# Patient Record
Sex: Female | Born: 1970 | Race: White | Hispanic: No | Marital: Married | Smoking: Never smoker
Health system: Southern US, Community
[De-identification: ages and names within clinical notes are randomized; demographics above are authoritative.]

## PROBLEM LIST (undated history)

## (undated) DIAGNOSIS — G43909 Migraine, unspecified, not intractable, without status migrainosus: Secondary | ICD-10-CM

## (undated) DIAGNOSIS — K219 Gastro-esophageal reflux disease without esophagitis: Secondary | ICD-10-CM

## (undated) DIAGNOSIS — D649 Anemia, unspecified: Secondary | ICD-10-CM

## (undated) HISTORY — PX: PLACEMENT OF BREAST IMPLANTS: SHX6334

## (undated) HISTORY — PX: CHOLECYSTECTOMY: SHX55

## (undated) HISTORY — DX: Anemia, unspecified: D64.9

## (undated) HISTORY — DX: Migraine, unspecified, not intractable, without status migrainosus: G43.909

## (undated) HISTORY — DX: Gastro-esophageal reflux disease without esophagitis: K21.9

---

## 1993-10-20 HISTORY — PX: SHOULDER SURGERY: SHX246

## 1998-02-05 ENCOUNTER — Other Ambulatory Visit: Admission: RE | Admit: 1998-02-05 | Discharge: 1998-02-05 | Payer: Self-pay | Admitting: Obstetrics and Gynecology

## 1998-04-09 ENCOUNTER — Emergency Department (HOSPITAL_COMMUNITY): Admission: EM | Admit: 1998-04-09 | Discharge: 1998-04-09 | Payer: Self-pay | Admitting: *Deleted

## 1998-10-10 ENCOUNTER — Inpatient Hospital Stay (HOSPITAL_COMMUNITY): Admission: EM | Admit: 1998-10-10 | Discharge: 1998-10-10 | Payer: Self-pay | Admitting: Emergency Medicine

## 1998-10-10 ENCOUNTER — Encounter: Payer: Self-pay | Admitting: Emergency Medicine

## 1999-07-09 ENCOUNTER — Other Ambulatory Visit: Admission: RE | Admit: 1999-07-09 | Discharge: 1999-07-09 | Payer: Self-pay | Admitting: Obstetrics & Gynecology

## 1999-11-26 ENCOUNTER — Emergency Department (HOSPITAL_COMMUNITY): Admission: EM | Admit: 1999-11-26 | Discharge: 1999-11-26 | Payer: Self-pay | Admitting: Emergency Medicine

## 2000-07-28 ENCOUNTER — Emergency Department (HOSPITAL_COMMUNITY): Admission: EM | Admit: 2000-07-28 | Discharge: 2000-07-28 | Payer: Self-pay | Admitting: Emergency Medicine

## 2001-01-10 ENCOUNTER — Emergency Department (HOSPITAL_COMMUNITY): Admission: EM | Admit: 2001-01-10 | Discharge: 2001-01-10 | Payer: Self-pay | Admitting: Emergency Medicine

## 2001-01-10 ENCOUNTER — Encounter: Payer: Self-pay | Admitting: Emergency Medicine

## 2001-03-19 ENCOUNTER — Emergency Department (HOSPITAL_COMMUNITY): Admission: EM | Admit: 2001-03-19 | Discharge: 2001-03-19 | Payer: Self-pay | Admitting: Emergency Medicine

## 2001-08-03 ENCOUNTER — Emergency Department (HOSPITAL_COMMUNITY): Admission: EM | Admit: 2001-08-03 | Discharge: 2001-08-03 | Payer: Self-pay | Admitting: Emergency Medicine

## 2001-08-08 ENCOUNTER — Emergency Department (HOSPITAL_COMMUNITY): Admission: EM | Admit: 2001-08-08 | Discharge: 2001-08-08 | Payer: Self-pay | Admitting: Emergency Medicine

## 2001-08-13 ENCOUNTER — Emergency Department (HOSPITAL_COMMUNITY): Admission: EM | Admit: 2001-08-13 | Discharge: 2001-08-14 | Payer: Self-pay | Admitting: Emergency Medicine

## 2001-08-14 ENCOUNTER — Encounter: Payer: Self-pay | Admitting: Emergency Medicine

## 2001-08-14 ENCOUNTER — Ambulatory Visit (HOSPITAL_COMMUNITY): Admission: RE | Admit: 2001-08-14 | Discharge: 2001-08-14 | Payer: Self-pay | Admitting: Emergency Medicine

## 2001-09-01 ENCOUNTER — Other Ambulatory Visit: Admission: RE | Admit: 2001-09-01 | Discharge: 2001-09-01 | Payer: Self-pay | Admitting: Obstetrics & Gynecology

## 2003-03-06 ENCOUNTER — Other Ambulatory Visit: Admission: RE | Admit: 2003-03-06 | Discharge: 2003-03-06 | Payer: Self-pay | Admitting: Obstetrics & Gynecology

## 2004-06-26 ENCOUNTER — Other Ambulatory Visit: Admission: RE | Admit: 2004-06-26 | Discharge: 2004-06-26 | Payer: Self-pay | Admitting: Obstetrics & Gynecology

## 2005-11-28 ENCOUNTER — Other Ambulatory Visit: Admission: RE | Admit: 2005-11-28 | Discharge: 2005-11-28 | Payer: Self-pay | Admitting: Obstetrics & Gynecology

## 2007-05-18 ENCOUNTER — Ambulatory Visit: Payer: Self-pay | Admitting: Gastroenterology

## 2007-06-29 ENCOUNTER — Encounter: Payer: Self-pay | Admitting: Gastroenterology

## 2007-06-29 ENCOUNTER — Ambulatory Visit: Payer: Self-pay | Admitting: Gastroenterology

## 2007-08-27 ENCOUNTER — Ambulatory Visit: Payer: Self-pay | Admitting: Gastroenterology

## 2007-12-09 DIAGNOSIS — F329 Major depressive disorder, single episode, unspecified: Secondary | ICD-10-CM | POA: Insufficient documentation

## 2007-12-09 DIAGNOSIS — K3189 Other diseases of stomach and duodenum: Secondary | ICD-10-CM | POA: Insufficient documentation

## 2007-12-09 DIAGNOSIS — K209 Esophagitis, unspecified without bleeding: Secondary | ICD-10-CM | POA: Insufficient documentation

## 2007-12-09 DIAGNOSIS — F3289 Other specified depressive episodes: Secondary | ICD-10-CM | POA: Insufficient documentation

## 2007-12-09 DIAGNOSIS — K279 Peptic ulcer, site unspecified, unspecified as acute or chronic, without hemorrhage or perforation: Secondary | ICD-10-CM | POA: Insufficient documentation

## 2007-12-09 DIAGNOSIS — R197 Diarrhea, unspecified: Secondary | ICD-10-CM | POA: Insufficient documentation

## 2007-12-09 DIAGNOSIS — G43909 Migraine, unspecified, not intractable, without status migrainosus: Secondary | ICD-10-CM | POA: Insufficient documentation

## 2007-12-09 DIAGNOSIS — K219 Gastro-esophageal reflux disease without esophagitis: Secondary | ICD-10-CM | POA: Insufficient documentation

## 2007-12-09 DIAGNOSIS — R1013 Epigastric pain: Secondary | ICD-10-CM

## 2007-12-09 DIAGNOSIS — Z8719 Personal history of other diseases of the digestive system: Secondary | ICD-10-CM | POA: Insufficient documentation

## 2009-02-08 ENCOUNTER — Ambulatory Visit (HOSPITAL_COMMUNITY): Admission: RE | Admit: 2009-02-08 | Discharge: 2009-02-08 | Payer: Self-pay | Admitting: Obstetrics & Gynecology

## 2009-02-08 ENCOUNTER — Encounter (INDEPENDENT_AMBULATORY_CARE_PROVIDER_SITE_OTHER): Payer: Self-pay | Admitting: Obstetrics & Gynecology

## 2010-03-12 ENCOUNTER — Inpatient Hospital Stay (HOSPITAL_COMMUNITY): Admission: AD | Admit: 2010-03-12 | Discharge: 2010-03-12 | Payer: Self-pay | Admitting: Obstetrics and Gynecology

## 2010-03-28 ENCOUNTER — Inpatient Hospital Stay (HOSPITAL_COMMUNITY): Admission: AD | Admit: 2010-03-28 | Discharge: 2010-03-31 | Payer: Self-pay | Admitting: Obstetrics and Gynecology

## 2010-12-27 ENCOUNTER — Other Ambulatory Visit: Payer: Self-pay | Admitting: Obstetrics & Gynecology

## 2011-01-06 LAB — CBC
HCT: 34.1 % — ABNORMAL LOW (ref 36.0–46.0)
Hemoglobin: 11.3 g/dL — ABNORMAL LOW (ref 12.0–15.0)
Hemoglobin: 11.7 g/dL — ABNORMAL LOW (ref 12.0–15.0)
MCHC: 33.7 g/dL (ref 30.0–36.0)
MCHC: 34.2 g/dL (ref 30.0–36.0)
MCV: 98.8 fL (ref 78.0–100.0)
MCV: 99 fL (ref 78.0–100.0)
Platelets: 161 10*3/uL (ref 150–400)
RBC: 3.4 MIL/uL — ABNORMAL LOW (ref 3.87–5.11)
RBC: 3.45 MIL/uL — ABNORMAL LOW (ref 3.87–5.11)
RDW: 13.3 % (ref 11.5–15.5)
RDW: 13.6 % (ref 11.5–15.5)
WBC: 8.6 10*3/uL (ref 4.0–10.5)

## 2011-01-06 LAB — RPR: RPR Ser Ql: NONREACTIVE

## 2011-01-29 LAB — CBC
MCHC: 34.5 g/dL (ref 30.0–36.0)
MCV: 99 fL (ref 78.0–100.0)
Platelets: 195 10*3/uL (ref 150–400)
RDW: 12.2 % (ref 11.5–15.5)

## 2011-03-04 NOTE — Assessment & Plan Note (Signed)
[facility]                         GASTROENTEROLOGY OFFICE NOTE   NAME:Martinez, Caitlin                   MRN:          [id number]  DATE:08/27/2007                            DOB:          03/13/1971    PROBLEM:  Abdominal pain.   Ms. Caitlin Martinez has returned for scheduled followup.  She currently is  entirely pain-free.  She is no longer taking anti-inflammatory  medicines.  Endoscopy demonstrated esophagitis.  Biopsies were negative  for Barrett's esophagus.   ON EXAM:  Pulse 62, blood pressure 118/72, weight 140.   IMPRESSION:  1. Dyspepsia - secondary to NSAID use.  2. GERD.   RECOMMENDATIONS:  1. Continue to hold NSAIDs.  2. Use Nexium as needed.     Barbette Hair. Arlyce Dice, MD,FACG  Electronically Signed    RDK/MedQ  DD: 08/27/2007  DT: 08/28/2007  Job #: [id number]

## 2011-03-04 NOTE — Assessment & Plan Note (Signed)
[facility]                         GASTROENTEROLOGY OFFICE NOTE   NAME:Caitlin Martinez, Caitlin Martinez                   MRN:          [id number]  DATE:05/18/2007                            DOB:          07-Oct-1971    REASON FOR CONSULTATION:  Abdominal pain.   Ms. Ginette Pitman is a pleasant 40 year old white female referred through  the Dr. Kathrynn Running for evaluation.  She has a history of gastric ulcers.  She also has a history of GERD, which is well-controlled with Nexium.  She has had multiple episodes of epigastric pain in the past, and was  diagnosed with gastric ulcers.  She does take Aleve on an intermittent  but regular basis for migraine headaches.  She has severe pyrosis if she  holds her Nexium.  She denies dysphagia, cough, or sore throat.  She has  had chronic diarrhea since her cholecystectomy.  There is no history of  melena or hematochezia.  Last endoscopy and colonoscopy was 5 years ago.   PAST MEDICAL HISTORY:  Pertinent for migraine headaches.  She is status  post cholecystectomy.  She has a history of depression.   FAMILY HISTORY:  Pertinent for father and brother with diabetes.   MEDICATIONS:  Nexium.  Inderal.  Zoloft.  Multivitamin.   She has no allergies.   She smokes.  She drinks rarely.  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   REVIEW OF SYSTEMS:  Reviewed and is negative.   PHYSICAL EXAM:  Pulse 68, blood pressure 96/70, weight 132.  HEENT:  EOMI. PERRLA. Sclerae are anicteric.  Conjunctivae are pink.  NECK:  Supple without thyromegaly, adenopathy or carotid bruits.  CHEST:  Clear to auscultation and percussion without adventitious  sounds.  CARDIAC:  Regular rhythm; normal S1 S2.  There are no murmurs, gallops  or rubs.  ABDOMEN:  Bowel sounds are normoactive.  Abdomen is soft, non-tender and  non-distended.  There are no abdominal masses, tenderness, splenic  enlargement or hepatomegaly.  EXTREMITIES:  Full range  of motion.  No cyanosis, clubbing or edema.  RECTAL:  Deferred.   IMPRESSION:  1. History of recurrent peptic ulcer disease.  I suspect this is      probably NSAID-related.  2. Gastroesophageal reflux disease - Barrett's esophagus ought to be      ruled out.   RECOMMENDATION:  1. Continue Nexium.  2. The patient was advised to avoid aspirin and aspirin products.  3. Upper endoscopy to rule out Barrett's esophagus.     Barbette Hair. Arlyce Dice, MD,FACG  Electronically Signed    RDK/MedQ  DD: 05/18/2007  DT: 05/18/2007  Job #: [id number]   cc:   Aleatha Borer, MD

## 2011-03-04 NOTE — Letter (Signed)
May 18, 2007    Aleatha Borer, MD  [address]  [place], [place] [address]   RE:  Caitlin Martinez, Caitlin Martinez  MRN:  [id number]  /  DOB:  01-29-1971   Dear Dr. Kathrynn Running :   Upon your kind referral, I had the pleasure of evaluating your patient  and I am pleased to offer my findings.  I saw Caitlin Martinez in the  office today.  Enclosed is a copy of my progress note that details my  findings and recommendations.   Thank you for the opportunity to participate in your patient's care.    Sincerely,      Barbette Hair. Arlyce Dice, MD,FACG  Electronically Signed    RDK/MedQ  DD: 05/18/2007  DT: 05/18/2007  Job #: [id number]

## 2011-03-04 NOTE — Op Note (Signed)
NAMESHANIE, Caitlin Martinez          ACCOUNT NO.:  [id number]   MEDICAL RECORD NO.:  [id number]          PATIENT TYPE:  AMB   LOCATION:  SDC                           FACILITY:  [facility]   PHYSICIAN:  Gerrit Friends. Aldona Bar, M.D.   DATE OF BIRTH:  14-Apr-1971   DATE OF PROCEDURE:  02/08/2009  DATE OF DISCHARGE:                               OPERATIVE REPORT   PREOPERATIVE DIAGNOSIS:  First trimester pregnancy loss, blood type O+.   POSTOPERATIVE DIAGNOSES:  First trimester pregnancy loss, blood type O+.   PATHOLOGY:  Pending.   PROCEDURE:  Suction dilatation and curettage for evacuation of first  trimester pregnancy loss.   SURGEON:  Gerrit Friends. Aldona Bar, M.D.   ANESTHESIA:  Intravenous conscious sedation plus paracervical block with  1% Xylocaine without epinephrine.   HISTORY:  This gravida 1 was progressing well during the early first  trimester of her pregnancy and was seen yesterday in the office for a  routine followup.  On ultrasound, unfortunately no growth have occurred  over the past week to 10 days and no fetal heart was seen.  Diagnosis of  first trimester pregnancy loss was made and the patient is being taken  to the operating room at this point for evacuation of her first  trimester pregnancy loss.   DESCRIPTION OF PROCEDURE:  The patient was taken to the operating where  after satisfactory induction of intravenous conscious sedation she was  prepped and draped having placed in the short Allen stirrups in the  modified-lithotomy position.  The bladder was drained of clear urine  with red rubber catheter in-and-out fashion.  At this time, a speculum  was placed in the vagina.  Cervical stability was obtained with a single-  tooth tenaculum and at this time and one paracervical block was carried  out using 1% Xylocaine without epinephrine - approximately 12 mL.   The internal os was dilated to #25 [facility] dilator without difficulty and  thereafter using #8 suction curette.  The cavity  was thoroughly, gently,  and systematically evacuated of all products conception.  This was  confirmed using a small standard curette.  We suctioned, produced no  additional tissue.  We curettage with a small standard curette, felt  what was probably a small septum at the fundus of the uterus, but  otherwise cavity was noted to be clean.  All instruments at this time  were removed.  Exam found the uterus to be firm, movable and normal  size.  The patient was awakened and transported to recovery area in  satisfactory condition, having tolerated procedure well.  Estimated  blood loss 25 mL.  All counts correct x2.   The patient will be observed and discharged home.  She was given  prescriptions yesterday for doxycycline 100 mg to use twice daily for a  total of 4 days and Anaprox DS to use every 8 hours as needed for  cramping.  She will be given instruction sheet at time of discharge and  will be followed up in the office in approximately 5 days' time.   Condition on arrival in the recovery room, satisfactory.  Gerrit Friends. Aldona Bar, M.D.  Electronically Signed     RMW/MEDQ  D:  02/08/2009  T:  02/09/2009  Job:  [id number]

## 2011-03-04 NOTE — Letter (Signed)
May 18, 2007    Aurther Loft   RE:  Caitlin Martinez, Caitlin Martinez  MRN:  [id number]  /  DOB:  1970-10-29   Dear Ms. Conigliaro:   It is my pleasure to have treated you recently as a new patient in my  office.  I appreciate your confidence and the opportunity to participate  in your care.   Since I do have a busy inpatient endoscopy schedule and office schedule,  my office hours vary weekly.  I am, however, available for emergency  calls every day through my office.  If I cannot promptly meet an urgent  office appointment, another one of our gastroenterologists will be able  to assist you.   My well-trained staff are prepared to help you at all times.  For  emergencies after office hours, a physician from our gastroenterology  section is always available through my 24-hour answering service.   While you are under my care, I encourage discussion of your questions  and concerns, and I will be happy to return your calls as soon as I am  available.   Once again, I welcome you as a new patient and I look forward to a happy  and healthy relationship.    Sincerely,      Barbette Hair. Arlyce Dice, MD,FACG  Electronically Signed   RDK/MedQ  DD: 05/18/2007  DT: 05/18/2007  Job #: [id number]

## 2012-07-23 ENCOUNTER — Other Ambulatory Visit: Payer: Self-pay

## 2014-01-24 ENCOUNTER — Other Ambulatory Visit: Payer: Self-pay | Admitting: Obstetrics & Gynecology

## 2014-10-20 HISTORY — PX: AUGMENTATION MAMMAPLASTY: SUR837

## 2015-02-13 ENCOUNTER — Other Ambulatory Visit: Payer: Self-pay | Admitting: Obstetrics & Gynecology

## 2015-02-15 LAB — CYTOLOGY - PAP

## 2016-02-18 ENCOUNTER — Other Ambulatory Visit: Payer: Self-pay | Admitting: Obstetrics & Gynecology

## 2016-02-18 DIAGNOSIS — Z01419 Encounter for gynecological examination (general) (routine) without abnormal findings: Secondary | ICD-10-CM | POA: Diagnosis not present

## 2016-02-18 DIAGNOSIS — Z6821 Body mass index (BMI) 21.0-21.9, adult: Secondary | ICD-10-CM | POA: Diagnosis not present

## 2016-02-18 DIAGNOSIS — Z124 Encounter for screening for malignant neoplasm of cervix: Secondary | ICD-10-CM | POA: Diagnosis not present

## 2016-02-19 LAB — CYTOLOGY - PAP

## 2016-06-27 DIAGNOSIS — Z0000 Encounter for general adult medical examination without abnormal findings: Secondary | ICD-10-CM | POA: Diagnosis not present

## 2016-08-03 DIAGNOSIS — Z23 Encounter for immunization: Secondary | ICD-10-CM | POA: Diagnosis not present

## 2016-08-10 DIAGNOSIS — M79604 Pain in right leg: Secondary | ICD-10-CM | POA: Diagnosis not present

## 2016-09-19 DIAGNOSIS — M542 Cervicalgia: Secondary | ICD-10-CM | POA: Diagnosis not present

## 2016-09-19 DIAGNOSIS — G43009 Migraine without aura, not intractable, without status migrainosus: Secondary | ICD-10-CM | POA: Diagnosis not present

## 2016-11-03 ENCOUNTER — Encounter (INDEPENDENT_AMBULATORY_CARE_PROVIDER_SITE_OTHER): Payer: Self-pay | Admitting: Orthopaedic Surgery

## 2016-11-03 ENCOUNTER — Encounter (INDEPENDENT_AMBULATORY_CARE_PROVIDER_SITE_OTHER): Payer: Self-pay

## 2016-11-03 ENCOUNTER — Ambulatory Visit (INDEPENDENT_AMBULATORY_CARE_PROVIDER_SITE_OTHER): Payer: BLUE CROSS/BLUE SHIELD | Admitting: Orthopaedic Surgery

## 2016-11-03 ENCOUNTER — Ambulatory Visit (INDEPENDENT_AMBULATORY_CARE_PROVIDER_SITE_OTHER): Payer: BLUE CROSS/BLUE SHIELD

## 2016-11-03 VITALS — BP 100/66 | HR 67 | Ht 62.0 in | Wt 120.0 lb

## 2016-11-03 DIAGNOSIS — S92354A Nondisplaced fracture of fifth metatarsal bone, right foot, initial encounter for closed fracture: Secondary | ICD-10-CM | POA: Diagnosis not present

## 2016-11-03 DIAGNOSIS — M79671 Pain in right foot: Secondary | ICD-10-CM

## 2016-11-03 NOTE — Patient Instructions (Signed)
Elevate right foot above heart level as much as possible to help decrease pain and swelling.  Recommend nonweightbearing right foot with crutches. Use cam walker.  Absolutely no running or jumping.

## 2016-11-03 NOTE — Progress Notes (Signed)
Office Visit Note   Patient: Caitlin JobsMelissa A Conigliaro-Mosher           Date of Birth: 1971-01-15           MRN: [id number] Visit Date: 11/03/2016              Requested by: No referring provider defined for this encounter. PCP: Mickie HillierLITTLE,KEVIN LORNE, MD   Assessment & Plan: Visit Diagnoses:  1. Pain in right foot   2. Closed nondisplaced fracture of fifth metatarsal bone of right foot, initial encounter     Plan: Patient was put in a cam walker today. Recommended that she be nonweightbearing right foot as much as possible with crutches. Elevate foot above heart level to help decrease pain and swelling. Oblique x-rays that show about a 2 mm fracture gap of the metatarsal shaft. The think that the use of a bone stimulator would be of great benefit. Out of work to rest this week and then she will start light duty restrictions January 22. We discussed no driving. Patient is in somewhat of a difficult situation because she is a single mom.  Follow-Up Instructions: Return in about 3 weeks (around 11/24/2016).   Orders:  Orders Placed This Encounter  Procedures  . XR Foot Complete Right  . DG BONE DENSITY (DXA)   No orders of the defined types were placed in this encounter.     Procedures: No procedures performed   Clinical Data: No additional findings.   Subjective: Chief Complaint  Patient presents with  . Right Foot - Pain    Patient presents with right foot pain. She was going up the steps last night and heard a "pop". She has pain at 5th metatarsal, is positive for swelling and bruising.   All of her pain is lateral aspect of her foot. No previous issues before injury.  Review of Systems  Constitutional: Positive for activity change.  HENT: Negative.   Eyes: Negative.   Respiratory: Negative.   Cardiovascular: Negative.   Gastrointestinal: Negative.   Genitourinary: Negative.   Musculoskeletal: Positive for gait problem.  Skin:       Bruising right foot    Psychiatric/Behavioral: Negative.      Objective: Vital Signs: BP 100/66   Pulse 67   Ht 5\' 2"  (1.575 m)   Wt 120 lb (54.4 kg)   BMI 21.95 kg/m   Physical Exam  Constitutional: She is oriented to person, place, and time. She appears well-developed. No distress.  HENT:  Head: Normocephalic and atraumatic.  Eyes: Pupils are equal, round, and reactive to light.  Neck: Normal range of motion.  Pulmonary/Chest: No respiratory distress.  Musculoskeletal:  She does have some lateral right foot bruising. Some swelling. Exquisitely tender over the right fifth metatarsal. Neurovascularly intact. Skin warm and dry.  Neurological: She is alert and oriented to person, place, and time.  Skin: Skin is warm and dry.  Psychiatric: She has a normal mood and affect.    Ortho Exam  Specialty Comments:  No specialty comments available.  Imaging: Xr Foot Complete Right  Result Date: 11/03/2016 Three-view x-ray right foot shows a spiral fracture of the fifth metatarsal. Oblique view shows that there is about a 2 mm fracture gap. Impression right fifth metatarsal spiral fracture.    PMFS History: Patient Active Problem List   Diagnosis Date Noted  . DEPRESSION 12/09/2007  . MIGRAINE, CHRONIC 12/09/2007  . ESOPHAGITIS 12/09/2007  . GERD 12/09/2007  . PEPTIC ULCER DISEASE 12/09/2007  .  DYSPEPSIA 12/09/2007  . DIARRHEA 12/09/2007  . CHOLELITHIASIS, HX OF 12/09/2007   Past Medical History:  Diagnosis Date  . Migraines     No family history on file.  Past Surgical History:  Procedure Laterality Date  . CHOLECYSTECTOMY    . PLACEMENT OF BREAST IMPLANTS     Social History   Occupational History  . Not on file.   Social History Main Topics  . Smoking status: Never Smoker  . Smokeless tobacco: Never Used  . Alcohol use Yes  . Drug use: No  . Sexual activity: Not on file

## 2016-11-07 ENCOUNTER — Telehealth (INDEPENDENT_AMBULATORY_CARE_PROVIDER_SITE_OTHER): Payer: Self-pay | Admitting: Orthopaedic Surgery

## 2016-11-07 NOTE — Telephone Encounter (Signed)
Patient called stating she have not heard from the rep that is suppose to show her how to use the bone stimulator machine. Patient asked for a call back as soon as possible. The number to contact her is  [phone]

## 2016-11-08 NOTE — Telephone Encounter (Signed)
I called patient and advised that Thurston Poundsrey has all of her information and should be contacting her. I did let her know that we were closed due to weather this past week and that I am not sure what his company may have done. She will call me back Tuesday afternoon if she has not heard from him and I will call him to check status.

## 2016-11-12 ENCOUNTER — Telehealth (INDEPENDENT_AMBULATORY_CARE_PROVIDER_SITE_OTHER): Payer: Self-pay | Admitting: Orthopaedic Surgery

## 2016-11-12 NOTE — Telephone Encounter (Signed)
Pt called back and stated she still hasn't heard back from the company to get the bone scan. See previous messages.  Please call [phone]

## 2016-11-14 DIAGNOSIS — S92354A Nondisplaced fracture of fifth metatarsal bone, right foot, initial encounter for closed fracture: Secondary | ICD-10-CM | POA: Diagnosis not present

## 2016-11-14 NOTE — Telephone Encounter (Signed)
Caitlin Martinez is meeting with patient today

## 2016-11-26 ENCOUNTER — Ambulatory Visit (INDEPENDENT_AMBULATORY_CARE_PROVIDER_SITE_OTHER): Payer: Self-pay

## 2016-11-26 ENCOUNTER — Encounter (INDEPENDENT_AMBULATORY_CARE_PROVIDER_SITE_OTHER): Payer: Self-pay | Admitting: Orthopaedic Surgery

## 2016-11-26 ENCOUNTER — Ambulatory Visit (INDEPENDENT_AMBULATORY_CARE_PROVIDER_SITE_OTHER): Payer: BLUE CROSS/BLUE SHIELD | Admitting: Orthopaedic Surgery

## 2016-11-26 VITALS — BP 105/66 | HR 71 | Ht 62.0 in | Wt 120.0 lb

## 2016-11-26 DIAGNOSIS — S92354D Nondisplaced fracture of fifth metatarsal bone, right foot, subsequent encounter for fracture with routine healing: Secondary | ICD-10-CM

## 2016-11-26 NOTE — Progress Notes (Signed)
   Office Visit Note   Patient: Jolayne HainesMelissa A Conigliaro-Mosher           Date of Birth: 1971-07-07           MRN: [id number] Visit Date: 11/26/2016              Requested by: Catha GosselinKevin Little, MD [address] [place], [place] [address] PCP: Mickie HillierLITTLE,KEVIN LORNE, MD   Assessment & Plan: Visit Diagnoses:  1. Closed nondisplaced fracture of fifth metatarsal bone of right foot with routine healing, subsequent encounter     Plan: Patient can use her tennis shoe in her house she is a mature with a single crutch. Office follow-up 4 weeks for repeat x-rays of her right foot AP lateral and oblique on return.  Follow-Up Instructions: Return in about 4 weeks (around 12/24/2016).   Orders:  Orders Placed This Encounter  Procedures  . XR Foot Complete Right   No orders of the defined types were placed in this encounter.     Procedures: No procedures performed   Clinical Data: No additional findings.   Subjective: Chief Complaint  Patient presents with  . Right Foot - Follow-up, Fracture    Patient returns for follow up right fifth metatarsal fracture. She has been using the bone stimulator for 12 days. She is doing well and only has a little pain after stimulation therapy. She is ambulating in CAM boot with one crutch today.     Review of Systems inform you systems updated is unchanged from last office visit.   Objective: Vital Signs: BP 105/66   Pulse 71   Ht 5\' 2"  (1.575 m)   Wt 120 lb (54.4 kg)   BMI 21.95 kg/m   Physical Exam is minimal swelling over the right fifth metatarsal shaft fracture. She is not tender at the fracture site.  Ortho Exam  Specialty Comments:  No specialty comments available.  Imaging: Xr Foot Complete Right  Result Date: 11/26/2016 Three-view x-rays right foot obtained AP lateral and oblique. This shows the fifth metatarsal shaft fracture oblique/spiral. There is some evidence of early healing. Impression: Right fifth metatarsal shaft  fracture.    PMFS History: Patient Active Problem List   Diagnosis Date Noted  . DEPRESSION 12/09/2007  . MIGRAINE, CHRONIC 12/09/2007  . ESOPHAGITIS 12/09/2007  . GERD 12/09/2007  . PEPTIC ULCER DISEASE 12/09/2007  . DYSPEPSIA 12/09/2007  . DIARRHEA 12/09/2007  . CHOLELITHIASIS, HX OF 12/09/2007   Past Medical History:  Diagnosis Date  . Migraines     No family history on file.  Past Surgical History:  Procedure Laterality Date  . CHOLECYSTECTOMY    . PLACEMENT OF BREAST IMPLANTS     Social History   Occupational History  . Not on file.   Social History Main Topics  . Smoking status: Never Smoker  . Smokeless tobacco: Never Used  . Alcohol use Yes  . Drug use: No  . Sexual activity: Not on file

## 2016-12-16 ENCOUNTER — Ambulatory Visit (INDEPENDENT_AMBULATORY_CARE_PROVIDER_SITE_OTHER): Payer: Self-pay

## 2016-12-16 ENCOUNTER — Encounter (INDEPENDENT_AMBULATORY_CARE_PROVIDER_SITE_OTHER): Payer: Self-pay | Admitting: Orthopaedic Surgery

## 2016-12-16 ENCOUNTER — Ambulatory Visit (INDEPENDENT_AMBULATORY_CARE_PROVIDER_SITE_OTHER): Payer: BLUE CROSS/BLUE SHIELD | Admitting: Orthopaedic Surgery

## 2016-12-16 VITALS — BP 95/60 | HR 62 | Ht 62.0 in | Wt 119.0 lb

## 2016-12-16 DIAGNOSIS — M79671 Pain in right foot: Secondary | ICD-10-CM

## 2016-12-16 DIAGNOSIS — S92354D Nondisplaced fracture of fifth metatarsal bone, right foot, subsequent encounter for fracture with routine healing: Secondary | ICD-10-CM

## 2016-12-16 NOTE — Progress Notes (Signed)
   Office Visit Note   Patient: Caitlin Martinez           Date of Birth: [birth year]           MRN: [id number] Visit Date: 12/16/2016              Requested by: Catha GosselinKevin Little, MD [address] [place], [place] [address] PCP: Mickie HillierLITTLE,KEVIN LORNE, MD   Assessment & Plan: Visit Diagnoses:  1. Closed nondisplaced fracture of fifth metatarsal bone of right foot with routine healing, subsequent encounter   2. Pain in right foot     Plan: X-rays demonstrate nice interval healing. She'll return in 4 weeks she can use her tennis shoe gradually progressed to walking and when she can walk without a limp she can begin walking fast and then gradually progress to jogging. We can repeat x-rays on return in 4 weeks.  Follow-Up Instructions: Return in about 4 weeks (around 01/13/2017).   Orders:  Orders Placed This Encounter  Procedures  . XR Foot Complete Right   No orders of the defined types were placed in this encounter.     Procedures: No procedures performed   Clinical Data: No additional findings.   Subjective: Chief Complaint  Patient presents with  . Right Foot - Follow-up    [name] is here to follow on her right foot fracture.  She states that it is doing well.  She has been using bone stim 2x per day for 3 and a half weeks now.    Review of Systems 14 point review systems updated   Objective: Vital Signs: BP 95/60 (BP Location: Right Arm, Patient Position: Sitting)   Pulse 62   Ht 5\' 2"  (1.575 m)   Wt 119 lb (54 kg)   BMI 21.77 kg/m   Physical Exam  Ortho Exam  Specialty Comments:  No specialty comments available.  Imaging: No results found.   PMFS History: Patient Active Problem List   Diagnosis Date Noted  . DEPRESSION 12/09/2007  . MIGRAINE, CHRONIC 12/09/2007  . ESOPHAGITIS 12/09/2007  . GERD 12/09/2007  . PEPTIC ULCER DISEASE 12/09/2007  . DYSPEPSIA 12/09/2007  . DIARRHEA 12/09/2007  . CHOLELITHIASIS, HX OF  12/09/2007   Past Medical History:  Diagnosis Date  . Migraines     No family history on file.  Past Surgical History:  Procedure Laterality Date  . CHOLECYSTECTOMY    . PLACEMENT OF BREAST IMPLANTS     Social History   Occupational History  . Not on file.   Social History Main Topics  . Smoking status: Never Smoker  . Smokeless tobacco: Never Used  . Alcohol use Yes  . Drug use: No  . Sexual activity: Not on file

## 2017-01-13 ENCOUNTER — Encounter (INDEPENDENT_AMBULATORY_CARE_PROVIDER_SITE_OTHER): Payer: Self-pay

## 2017-01-13 ENCOUNTER — Ambulatory Visit (INDEPENDENT_AMBULATORY_CARE_PROVIDER_SITE_OTHER): Payer: BLUE CROSS/BLUE SHIELD | Admitting: Orthopaedic Surgery

## 2017-01-31 DIAGNOSIS — J069 Acute upper respiratory infection, unspecified: Secondary | ICD-10-CM | POA: Diagnosis not present

## 2017-03-03 DIAGNOSIS — Z6822 Body mass index (BMI) 22.0-22.9, adult: Secondary | ICD-10-CM | POA: Diagnosis not present

## 2017-03-03 DIAGNOSIS — Z01419 Encounter for gynecological examination (general) (routine) without abnormal findings: Secondary | ICD-10-CM | POA: Diagnosis not present

## 2017-03-24 DIAGNOSIS — M542 Cervicalgia: Secondary | ICD-10-CM | POA: Diagnosis not present

## 2017-03-24 DIAGNOSIS — G43009 Migraine without aura, not intractable, without status migrainosus: Secondary | ICD-10-CM | POA: Diagnosis not present

## 2017-06-25 DIAGNOSIS — Z0000 Encounter for general adult medical examination without abnormal findings: Secondary | ICD-10-CM | POA: Diagnosis not present

## 2017-08-05 DIAGNOSIS — Z23 Encounter for immunization: Secondary | ICD-10-CM | POA: Diagnosis not present

## 2017-10-26 DIAGNOSIS — G43009 Migraine without aura, not intractable, without status migrainosus: Secondary | ICD-10-CM | POA: Diagnosis not present

## 2017-10-26 DIAGNOSIS — M542 Cervicalgia: Secondary | ICD-10-CM | POA: Diagnosis not present

## 2017-12-07 DIAGNOSIS — F411 Generalized anxiety disorder: Secondary | ICD-10-CM | POA: Diagnosis not present

## 2018-03-16 DIAGNOSIS — Z6822 Body mass index (BMI) 22.0-22.9, adult: Secondary | ICD-10-CM | POA: Diagnosis not present

## 2018-03-16 DIAGNOSIS — Z01419 Encounter for gynecological examination (general) (routine) without abnormal findings: Secondary | ICD-10-CM | POA: Diagnosis not present

## 2018-03-16 DIAGNOSIS — N898 Other specified noninflammatory disorders of vagina: Secondary | ICD-10-CM | POA: Diagnosis not present

## 2018-03-16 DIAGNOSIS — Z124 Encounter for screening for malignant neoplasm of cervix: Secondary | ICD-10-CM | POA: Diagnosis not present

## 2018-05-18 ENCOUNTER — Ambulatory Visit (INDEPENDENT_AMBULATORY_CARE_PROVIDER_SITE_OTHER): Payer: Self-pay

## 2018-05-18 ENCOUNTER — Encounter (INDEPENDENT_AMBULATORY_CARE_PROVIDER_SITE_OTHER): Payer: Self-pay | Admitting: Orthopaedic Surgery

## 2018-05-18 ENCOUNTER — Ambulatory Visit (INDEPENDENT_AMBULATORY_CARE_PROVIDER_SITE_OTHER): Payer: BLUE CROSS/BLUE SHIELD | Admitting: Orthopaedic Surgery

## 2018-05-18 VITALS — BP 109/68 | HR 70 | Ht 62.0 in | Wt 119.0 lb

## 2018-05-18 DIAGNOSIS — M79604 Pain in right leg: Secondary | ICD-10-CM | POA: Diagnosis not present

## 2018-05-18 NOTE — Progress Notes (Signed)
Office Visit Note   Patient: Caitlin Martinez           Date of Birth: 1971-09-14           MRN: [id number] Visit Date: 05/18/2018              Requested by: Catha GosselinLittle, Kevin, MD [address] [place], [place] [address] PCP: Catha GosselinLittle, Kevin, MD   Assessment & Plan: Visit Diagnoses:  1. Pain in right leg     Plan: Conservative treatment recommended.  She can continue anti-inflammatories intermittently.  X-rays are negative for stress fracture.  We discussed this may be related to the lumbar spine and I plan to recheck her in 1 month if she is having persistent symptoms with continued conservative treatment will consider MRI imaging of the lumbar spine.  She is a chronic runner and exercises regularly and is unwilling to modify her running activity.  Follow-Up Instructions: Return in about 1 month (around 06/18/2018).   Orders:  Orders Placed This Encounter  Procedures  . XR Lumbar Spine 2-3 Views   No orders of the defined types were placed in this encounter.     Procedures: No procedures performed   Clinical Data: No additional findings.   Subjective: Chief Complaint  Patient presents with  . Right Leg - Pain    HPI 47 year old female runner who does 8 to 10 miles per week and then does crunches regularly has had pain around her right hip buttocks right lateral hip and slightly anteriorly in the groin that does not stop her from running but aches afterwards.  She is noticed a little bit of numbness and tingling in her leg at times.  She is been treated in the past for nondisplaced fracture of the fifth metatarsal.  Negative for fever or chills.  No associated neck pain.  Review of Systems 14 point review of system positive for depression, migraines, GERD, dysphasia, cholecystectomy.   Objective: Vital Signs: BP 109/68   Pulse 70   Ht 5\' 2"  (1.575 m)   Wt 119 lb (54 kg)   BMI 21.77 kg/m   Physical Exam  Constitutional: She is oriented to person,  place, and time. She appears well-developed.  HENT:  Head: Normocephalic.  Right Ear: External ear normal.  Left Ear: External ear normal.  Eyes: Pupils are equal, round, and reactive to light.  Neck: No tracheal deviation present. No thyromegaly present.  Cardiovascular: Normal rate.  Pulmonary/Chest: Effort normal.  Abdominal: Soft.  Neurological: She is alert and oriented to person, place, and time.  Skin: Skin is warm and dry.  Psychiatric: She has a normal mood and affect. Her behavior is normal.    Ortho Exam patient has negative straight leg raising right and left.  Normal hip range of motion.  She can heel and toe walk.  Negative Faber test.  No anterior groin tenderness.  Some tenderness over the gluteus medius none over the greater trochanter mild sciatic notch tenderness on the right negative on the left.  Anterior tib gastrocsoleus is strong.  No tenderness with palpation of the lumbar spine.  No midline defects.  Negative reverse straight leg raising.  Specialty Comments:  No specialty comments available.  Imaging: Xr Lumbar Spine 2-3 Views  Result Date: 05/19/2018 AP lateral lumbar spine x-rays obtained and reviewed.  This shows previous gallbladder clips.  No scoliosis.  Patient has a transitional S1-S2 disc.  There is greater than 50% narrowing endplate spurring and endplate sclerosis at L5-S1.  Impression: Negative for acute changes.  Narrowing and spurring at L5-S1.    PMFS History: Patient Active Problem List   Diagnosis Date Noted  . DEPRESSION 12/09/2007  . MIGRAINE, CHRONIC 12/09/2007  . ESOPHAGITIS 12/09/2007  . GERD 12/09/2007  . PEPTIC ULCER DISEASE 12/09/2007  . DYSPEPSIA 12/09/2007  . DIARRHEA 12/09/2007  . CHOLELITHIASIS, HX OF 12/09/2007   Past Medical History:  Diagnosis Date  . Migraines     History reviewed. No pertinent family history.  Past Surgical History:  Procedure Laterality Date  . CHOLECYSTECTOMY    . PLACEMENT OF BREAST IMPLANTS      Social History   Occupational History  . Not on file  Tobacco Use  . Smoking status: Never Smoker  . Smokeless tobacco: Never Used  Substance and Sexual Activity  . Alcohol use: Yes  . Drug use: No  . Sexual activity: Not on file

## 2018-05-19 ENCOUNTER — Encounter (INDEPENDENT_AMBULATORY_CARE_PROVIDER_SITE_OTHER): Payer: Self-pay | Admitting: Orthopaedic Surgery

## 2018-06-11 DIAGNOSIS — G43009 Migraine without aura, not intractable, without status migrainosus: Secondary | ICD-10-CM | POA: Diagnosis not present

## 2018-06-23 DIAGNOSIS — Z0000 Encounter for general adult medical examination without abnormal findings: Secondary | ICD-10-CM | POA: Diagnosis not present

## 2018-06-25 ENCOUNTER — Ambulatory Visit (INDEPENDENT_AMBULATORY_CARE_PROVIDER_SITE_OTHER): Payer: BLUE CROSS/BLUE SHIELD | Admitting: Orthopaedic Surgery

## 2018-08-11 DIAGNOSIS — Z23 Encounter for immunization: Secondary | ICD-10-CM | POA: Diagnosis not present

## 2018-08-24 DIAGNOSIS — F413 Other mixed anxiety disorders: Secondary | ICD-10-CM | POA: Diagnosis not present

## 2018-10-07 DIAGNOSIS — M542 Cervicalgia: Secondary | ICD-10-CM | POA: Diagnosis not present

## 2018-10-07 DIAGNOSIS — H9191 Unspecified hearing loss, right ear: Secondary | ICD-10-CM | POA: Diagnosis not present

## 2018-10-07 DIAGNOSIS — H6121 Impacted cerumen, right ear: Secondary | ICD-10-CM | POA: Diagnosis not present

## 2018-10-11 DIAGNOSIS — M542 Cervicalgia: Secondary | ICD-10-CM | POA: Diagnosis not present

## 2018-10-11 DIAGNOSIS — G43009 Migraine without aura, not intractable, without status migrainosus: Secondary | ICD-10-CM | POA: Diagnosis not present

## 2018-11-08 DIAGNOSIS — N926 Irregular menstruation, unspecified: Secondary | ICD-10-CM | POA: Diagnosis not present

## 2018-11-08 DIAGNOSIS — Z6822 Body mass index (BMI) 22.0-22.9, adult: Secondary | ICD-10-CM | POA: Diagnosis not present

## 2018-12-03 ENCOUNTER — Ambulatory Visit (INDEPENDENT_AMBULATORY_CARE_PROVIDER_SITE_OTHER): Payer: Self-pay

## 2018-12-03 ENCOUNTER — Ambulatory Visit (INDEPENDENT_AMBULATORY_CARE_PROVIDER_SITE_OTHER): Payer: BLUE CROSS/BLUE SHIELD | Admitting: Surgery

## 2018-12-03 ENCOUNTER — Encounter (INDEPENDENT_AMBULATORY_CARE_PROVIDER_SITE_OTHER): Payer: Self-pay | Admitting: Surgery

## 2018-12-03 DIAGNOSIS — M25511 Pain in right shoulder: Secondary | ICD-10-CM

## 2018-12-03 DIAGNOSIS — S46911A Strain of unspecified muscle, fascia and tendon at shoulder and upper arm level, right arm, initial encounter: Secondary | ICD-10-CM | POA: Diagnosis not present

## 2018-12-03 NOTE — Progress Notes (Signed)
Office Visit Note   Patient: Caitlin Martinez           Date of Birth: [birth year]           MRN: [id number] Visit Date: 12/03/2018              Requested by: Catha Gosselin, MD [address] [place], [place] [address] PCP: Catha Gosselin, MD   Assessment & Plan: Visit Diagnoses:  1. Acute pain of right shoulder   2. Strain of right shoulder, initial encounter     Plan:  Advised patient to take it easy over the next couple weeks and not stress her right shoulder at the gym.  No heavy lifting.  She will follow-up in 2 weeks for recheck if she continues to be symptomatic over her long head biceps tendon and question subscap I may consider getting an MRI at that time.  Patient has a history of peptic ulcer disease so I cannot recommend that she use oral NSAIDs at this time.  Follow-Up Instructions: Return in about 2 weeks (around 12/17/2018) for with [facility] recheck shoulder.   Orders:  Orders Placed This Encounter  Procedures  . XR Shoulder Right   No orders of the defined types were placed in this encounter.     Procedures: No procedures performed   Clinical Data: No additional findings.   Subjective: Chief Complaint  Patient presents with  . Right Shoulder - Pain    HPI 48 year old female comes in today with complaints of right anterior shoulder pain.  States that a couple weeks ago she was using a pull-up bar and brought her feet up to touch the bar doing something of a gymnastics type maneuver.  A couple days later she noticed a strain in the anterior right shoulder.  No previous problems with her shoulder before onset.  Pain worse with shoulder abducted and externally rotated.  No complaints of instability.  Patient has history of peptic ulcer disease and is not supposed to take any oral NSAIDs although she says that she has been using ibuprofen 600 mg intermittently.  No cervical spine or radicular component.  Patient works out several days a week and this  has been affecting that. Review of Systems No current cardiac pulmonary GI GU issues  Objective: Vital Signs: There were no vitals taken for this visit.  Physical Exam HENT:     Head: Normocephalic and atraumatic.     Mouth/Throat:     Mouth: Mucous membranes are dry.  Eyes:     Extraocular Movements: Extraocular movements intact.     Pupils: Pupils are equal, round, and reactive to light.  Pulmonary:     Effort: No respiratory distress.  Musculoskeletal:     Comments: Right shoulder she has good range of motion but with some discomfort.  Mild to moderate tenderness over the proximal biceps tendon.  No tendon defect.  Patient does have some anterior shoulder discomfort with subscap resistance.  All other rotator cuff maneuvers intact.  Negative drop arm test.  Negative impingement test.  No pain or weakness with biceps resistance.  Neurovascularly intact.    Neurological:     General: No focal deficit present.     Mental Status: She is alert and oriented to person, place, and time.  Psychiatric:        Mood and Affect: Mood normal.        Behavior: Behavior normal.     Ortho Exam  Specialty Comments:  No specialty comments  available.  Imaging: No results found.   PMFS History: Patient Active Problem List   Diagnosis Date Noted  . DEPRESSION 12/09/2007  . MIGRAINE, CHRONIC 12/09/2007  . ESOPHAGITIS 12/09/2007  . GERD 12/09/2007  . PEPTIC ULCER DISEASE 12/09/2007  . DYSPEPSIA 12/09/2007  . DIARRHEA 12/09/2007  . CHOLELITHIASIS, HX OF 12/09/2007   Past Medical History:  Diagnosis Date  . Migraines     History reviewed. No pertinent family history.  Past Surgical History:  Procedure Laterality Date  . CHOLECYSTECTOMY    . PLACEMENT OF BREAST IMPLANTS     Social History   Occupational History  . Not on file  Tobacco Use  . Smoking status: Never Smoker  . Smokeless tobacco: Never Used  Substance and Sexual Activity  . Alcohol use: Yes  . Drug use: No  .  Sexual activity: Not on file

## 2018-12-22 ENCOUNTER — Ambulatory Visit (INDEPENDENT_AMBULATORY_CARE_PROVIDER_SITE_OTHER): Payer: BLUE CROSS/BLUE SHIELD | Admitting: Surgery

## 2018-12-27 DIAGNOSIS — N926 Irregular menstruation, unspecified: Secondary | ICD-10-CM | POA: Diagnosis not present

## 2018-12-29 ENCOUNTER — Encounter (INDEPENDENT_AMBULATORY_CARE_PROVIDER_SITE_OTHER): Payer: Self-pay | Admitting: Surgery

## 2018-12-29 ENCOUNTER — Ambulatory Visit (INDEPENDENT_AMBULATORY_CARE_PROVIDER_SITE_OTHER): Payer: BLUE CROSS/BLUE SHIELD | Admitting: Surgery

## 2018-12-29 ENCOUNTER — Other Ambulatory Visit: Payer: Self-pay

## 2018-12-29 DIAGNOSIS — M25511 Pain in right shoulder: Secondary | ICD-10-CM

## 2018-12-29 NOTE — Progress Notes (Signed)
Office Visit Note   Patient: Caitlin Martinez           Date of Birth: [birth year]           MRN: [id number] Visit Date: 12/29/2018              Requested by: Catha Gosselin, MD [address] [place], [place] [address] PCP: Catha Gosselin, MD   Assessment & Plan: Visit Diagnoses:  1. Acute pain of right shoulder     Plan: With patient's ongoing pain I think it would be best to get MRI right shoulder with and without contrast to rule out labral tear, possible SLAP tear, subscap tear.  Advised patient to call me a couple of days after the completion of her study and I will give her the results over the phone and decide next course of action at that time.  All questions answered.  Follow-Up Instructions: Return in about 4 weeks (around 01/26/2019) for with Amjad Fikes.   Orders:  Orders Placed This Encounter  Procedures  . MR SHOULDER RIGHT W WO CONTRAST   No orders of the defined types were placed in this encounter.     Procedures: No procedures performed   Clinical Data: No additional findings.   Subjective: Chief Complaint  Patient presents with  . Right Shoulder - Pain, Follow-up    HPI 48 year old white female returns for recheck of her right shoulder pain.  States that shoulder pain had decreased after modifying her activity.  Last few days she did try to step up her activities with pulling weeds and doing a fair amount of housework and this did cause anterior shoulder pain to return.  She has not been able to return back to activities with working out due to the pain.  No complaints of cervical spine pain or radicular symptoms. Review of Systems No current cardiac pulmonary GI GU issues  Objective: Vital Signs: There were no vitals taken for this visit.  Physical Exam HENT:     Head: Normocephalic and atraumatic.  Eyes:     Extraocular Movements: Extraocular movements intact.     Pupils: Pupils are equal, round, and reactive to light.  Pulmonary:    Effort: Pulmonary effort is normal.     Breath sounds: Normal breath sounds.  Musculoskeletal:     Comments: Gait is normal.  Sore spine good range of motion.  Right shoulder good range of motion but with some discomfort going overhead.  Moderate to market positive O'Brien test.  Tender over the proximal biceps tendon.  Good cuff strength.  Skin:    General: Skin is warm and dry.  Neurological:     General: No focal deficit present.     Mental Status: She is alert and oriented to person, place, and time.  Psychiatric:        Mood and Affect: Mood normal.     Ortho Exam  Specialty Comments:  No specialty comments available.  Imaging: No results found.   PMFS History: Patient Active Problem List   Diagnosis Date Noted  . DEPRESSION 12/09/2007  . MIGRAINE, CHRONIC 12/09/2007  . ESOPHAGITIS 12/09/2007  . GERD 12/09/2007  . PEPTIC ULCER DISEASE 12/09/2007  . DYSPEPSIA 12/09/2007  . DIARRHEA 12/09/2007  . CHOLELITHIASIS, HX OF 12/09/2007   Past Medical History:  Diagnosis Date  . Migraines     History reviewed. No pertinent family history.  Past Surgical History:  Procedure Laterality Date  . CHOLECYSTECTOMY    . PLACEMENT OF BREAST  IMPLANTS     Social History   Occupational History  . Not on file  Tobacco Use  . Smoking status: Never Smoker  . Smokeless tobacco: Never Used  Substance and Sexual Activity  . Alcohol use: Yes  . Drug use: No  . Sexual activity: Not on file

## 2019-01-12 ENCOUNTER — Ambulatory Visit
Admission: RE | Admit: 2019-01-12 | Discharge: 2019-01-12 | Disposition: A | Discharge status: Home / Self Care | Payer: BLUE CROSS/BLUE SHIELD | Source: Ambulatory Visit | Attending: Surgery | Admitting: Surgery

## 2019-01-12 ENCOUNTER — Other Ambulatory Visit: Payer: Self-pay

## 2019-01-12 ENCOUNTER — Inpatient Hospital Stay: Admission: RE | Admit: 2019-01-12 | Payer: Self-pay | Source: Ambulatory Visit

## 2019-01-12 DIAGNOSIS — M25511 Pain in right shoulder: Secondary | ICD-10-CM | POA: Diagnosis not present

## 2019-01-12 MED ORDER — GADOBENATE DIMEGLUMINE 529 MG/ML IV SOLN
10.0000 mL | Freq: Once | INTRAVENOUS | Status: AC | PRN
  Administered 2019-01-12: 10 mL via INTRAVENOUS

## 2019-01-13 ENCOUNTER — Telehealth (INDEPENDENT_AMBULATORY_CARE_PROVIDER_SITE_OTHER): Payer: Self-pay | Admitting: Surgery

## 2019-01-13 NOTE — Telephone Encounter (Signed)
Today I had a phone conversation with patient regards to right shoulder MRI scan that was performed January 12, 2019.  Report showed:  EXAM: MRI OF THE RIGHT SHOULDER WITHOUT AND WITH CONTRAST  TECHNIQUE: Multiplanar, multisequence MR imaging of the RIGHT shoulder was performed before and after the administration of intravenous contrast.  CONTRAST:  69mL MULTIHANCE GADOBENATE DIMEGLUMINE 529 MG/ML IV SOLN  COMPARISON:  None.  FINDINGS: Rotator cuff: Tendinosis of the supraspinatus, infraspinatus and subscapularis tendons without frank tear or retraction. Intact teres minor.  Muscles:  No muscle atrophy or abnormal signal.  Biceps long head:  Intact and appropriately seated.  Acromioclavicular Joint: Mild arthropathy of the acromioclavicular joint. Type 3 hook shaped acromion which contributes to extrinsic impression on the myotendinous junction of the supraspinatus. Lateral acromial osteophyte along the undersurface, series 4/6. Subdeltoid bursal fluid is noted.  Glenohumeral Joint: No joint effusion.  Labrum: Grossly intact, but evaluation is limited by lack of intraarticular fluid.  Bones: Subcortical cystic change along the superolateral aspect of the humeral head may represent stigmata of impingement full-thickness cartilaginous fissuring.  Other: None  IMPRESSION: 1. Tendinosis of the supraspinatus, infraspinatus and subscapularis tendons without bursal or articular surfacing tear. 2. Intact glenoid labrum. 3. Shaped acromion with extrinsic impression on myotendinous junction of the supraspinatus. Subcortical cystic change of the humeral head may represent stigmata of chronic impingement.   States that shoulder continues to be painful with overactivity.  She will come in to see me Monday afternoon for subacromial Marcaine/Depo-Medrol injection.  Depending on her response to that injection may also consider doing glenohumeral intra-articular injection  at some point.  Briefly discussed that with the changes that she has on scan that ultimately may come down to her needing outpatient arthroscopy with debridement and subacromial decompression.

## 2019-01-13 NOTE — Telephone Encounter (Signed)
Patient called stated had MRI and as told by yu to call and get results.  Please call patient @ [phone]

## 2019-01-14 ENCOUNTER — Telehealth (INDEPENDENT_AMBULATORY_CARE_PROVIDER_SITE_OTHER): Payer: Self-pay | Admitting: Radiology

## 2019-01-14 NOTE — Telephone Encounter (Signed)
Called and spoke with patient. Patient answered NO to all pre screening questions.  

## 2019-01-17 ENCOUNTER — Other Ambulatory Visit: Payer: Self-pay

## 2019-01-17 ENCOUNTER — Ambulatory Visit (INDEPENDENT_AMBULATORY_CARE_PROVIDER_SITE_OTHER): Payer: BLUE CROSS/BLUE SHIELD | Admitting: Specialist

## 2019-01-17 ENCOUNTER — Ambulatory Visit (INDEPENDENT_AMBULATORY_CARE_PROVIDER_SITE_OTHER): Payer: Self-pay

## 2019-01-17 ENCOUNTER — Encounter (INDEPENDENT_AMBULATORY_CARE_PROVIDER_SITE_OTHER): Payer: Self-pay | Admitting: Specialist

## 2019-01-17 VITALS — BP 106/74 | HR 69 | Ht 62.0 in | Wt 123.0 lb

## 2019-01-17 DIAGNOSIS — M7541 Impingement syndrome of right shoulder: Secondary | ICD-10-CM

## 2019-01-17 DIAGNOSIS — M542 Cervicalgia: Secondary | ICD-10-CM

## 2019-01-17 MED ORDER — METHYLPREDNISOLONE ACETATE 40 MG/ML IJ SUSP
40.0000 mg | INTRAMUSCULAR | Status: AC | PRN
  Administered 2019-01-17: 40 mg via INTRA_ARTICULAR

## 2019-01-17 MED ORDER — LIDOCAINE HCL 1 % IJ SOLN
3.0000 mL | INTRAMUSCULAR | Status: AC | PRN
  Administered 2019-01-17: 3 mL

## 2019-01-17 MED ORDER — BUPIVACAINE HCL 0.25 % IJ SOLN
4.0000 mL | INTRAMUSCULAR | Status: AC | PRN
  Administered 2019-01-17: 4 mL via INTRA_ARTICULAR

## 2019-01-17 NOTE — Progress Notes (Signed)
Office Visit Note   Patient: Caitlin Martinez           Date of Birth: November 15, 1970           MRN: [id number] Visit Date: 01/17/2019              Requested by: Catha Gosselin, MD [address] [place], [place] [address] PCP: Catha Gosselin, MD   Assessment & Plan: Visit Diagnoses:  1. Impingement syndrome of right shoulder   2. Neck pain     Plan: Today right shoulder subacromial Marcaine/Depo-Medrol injection was performed as discussed previously.  Tolerated without complication.  We will see how patient does with this.  Described that if she does not get good relief it may ultimately come down to needing outpatient arthroscopy debridement and subacromial decompression providing that surgical intervention is not done in the distant future.  In a couple weeks if she does not notice dramatic improvement we may consider trying intra-articular glenohumeral injection.  Before patient left the office she mentioned seeing a therapist that had been doing some sort of right trapezius, neck and scapular scraping techniques for spasms.  She did mention that she has had some issues with chronic neck pain and that this is also been discussed with her neurologist who treats her for migraines.  We will actually have patient follow-up me in 4 weeks for recheck.  I told her that the anterior shoulder discomfort that she has may actually be related to her cervical spine.  If this is not better at follow-up may consider getting MRI cervical spine to rule out HNP/stenosis.  She asked about chiropractic treatments and I recommended that she hold off on that.  Can continue massage therapy.  Use heat off and on.  gentle stretching of the shoulder.  Follow-Up Instructions: Return in about 4 weeks (around 02/14/2019).   Orders:  Orders Placed This Encounter  Procedures  . Large Joint Inj: R subacromial bursa  . Large Joint Inj  . XR Cervical Spine 2 or 3 views   No orders of the defined types were  placed in this encounter.     Procedures: Large Joint Inj: R subacromial bursa on 01/17/2019 2:32 PM Indications: pain Details: 25 G 1.5 in needle Medications: 3 mL lidocaine 1 %; 4 mL bupivacaine 0.25 %; 40 mg methylPREDNISolone acetate 40 MG/ML Outcome: tolerated well, no immediate complications Consent was given by the patient. Patient was prepped and draped in the usual sterile fashion.       Clinical Data: No additional findings.   Subjective: Chief Complaint  Patient presents with  . Right Shoulder - Follow-up    Here for Right shoulder injection with Fayrene Fearing    HPI Patient comes in today for right shoulder subacromial Marcaine/Depo-Medrol injection as discussed with me by telephone conversation last week.  I also reviewed patient's right shoulder MRI results with her at that time.  Please see that note.  Today patient also states that anterior shoulder pain can be described as being constant at times.  She also mentions some intermittent numbness and tingling that she has had and into the right forearm.   Objective: Vital Signs: BP 106/74 (BP Location: Left Arm, Patient Position: Sitting)   Pulse 69   Ht 5\' 2"  (1.575 m)   Wt 123 lb (55.8 kg)   BMI 22.50 kg/m   Physical Exam HENT:     Head: Normocephalic.  Eyes:     Extraocular Movements: Extraocular movements intact.  Pupils: Pupils are equal, round, and reactive to light.  Neck:     Comments: Good cervical spine ROM.  Some relief of right scapular/shoulder pain with cervical distraction.  Negative Spurling test.  Moderate right brachial plexus, trapezius and scapular border tenderness.  Mild on the left side. Musculoskeletal:     Comments: Right shoulder positive impingement test.  Negative drop arm test.  Good cuff strength.  Neurovascular intact.  Neurological:     General: No focal deficit present.     Mental Status: She is oriented to person, place, and time.    Gaylord Shih Exam  Specialty Comments:   No specialty comments available.  Imaging: No results found.   PMFS History: Patient Active Problem List   Diagnosis Date Noted  . DEPRESSION 12/09/2007  . MIGRAINE, CHRONIC 12/09/2007  . ESOPHAGITIS 12/09/2007  . GERD 12/09/2007  . PEPTIC ULCER DISEASE 12/09/2007  . DYSPEPSIA 12/09/2007  . DIARRHEA 12/09/2007  . CHOLELITHIASIS, HX OF 12/09/2007   Past Medical History:  Diagnosis Date  . Migraines     No family history on file.  Past Surgical History:  Procedure Laterality Date  . CHOLECYSTECTOMY    . PLACEMENT OF BREAST IMPLANTS     Social History   Occupational History  . Not on file  Tobacco Use  . Smoking status: Never Smoker  . Smokeless tobacco: Never Used  Substance and Sexual Activity  . Alcohol use: Yes  . Drug use: No  . Sexual activity: Not on file

## 2019-01-25 ENCOUNTER — Telehealth (INDEPENDENT_AMBULATORY_CARE_PROVIDER_SITE_OTHER): Payer: Self-pay | Admitting: *Deleted

## 2019-01-25 DIAGNOSIS — R350 Frequency of micturition: Secondary | ICD-10-CM | POA: Diagnosis not present

## 2019-01-26 ENCOUNTER — Ambulatory Visit (INDEPENDENT_AMBULATORY_CARE_PROVIDER_SITE_OTHER): Payer: BLUE CROSS/BLUE SHIELD | Admitting: Orthopedic Surgery

## 2019-02-14 ENCOUNTER — Ambulatory Visit (INDEPENDENT_AMBULATORY_CARE_PROVIDER_SITE_OTHER): Payer: BLUE CROSS/BLUE SHIELD | Admitting: Specialist

## 2019-03-30 DIAGNOSIS — F411 Generalized anxiety disorder: Secondary | ICD-10-CM | POA: Diagnosis not present

## 2019-05-19 ENCOUNTER — Ambulatory Visit (INDEPENDENT_AMBULATORY_CARE_PROVIDER_SITE_OTHER): Payer: BC Managed Care – PPO | Admitting: Surgery

## 2019-05-19 ENCOUNTER — Encounter: Payer: Self-pay | Admitting: Surgery

## 2019-05-19 ENCOUNTER — Other Ambulatory Visit: Payer: Self-pay

## 2019-05-19 DIAGNOSIS — M7541 Impingement syndrome of right shoulder: Secondary | ICD-10-CM | POA: Diagnosis not present

## 2019-05-19 NOTE — Addendum Note (Signed)
Addended by: Hortencia Pilar on: 05/19/2019 10:21 AM   Modules accepted: Orders

## 2019-05-19 NOTE — Progress Notes (Signed)
Office Visit Note   Patient: Caitlin Martinez           Date of Birth: 1971/09/09           MRN: [id number] Visit Date: 05/19/2019              Requested by: Catha GosselinLittle, Kevin, MD [address] [place],  [place] [id number] PCP: Catha GosselinLittle, Kevin, MD   Assessment & Plan: Visit Diagnoses:  1. Impingement syndrome of right shoulder     Plan: With patient's chronic right shoulder pain that is failed conservative treatment with previous subacromial Marcaine/Depo-Medrol injection I think it would be best that she see Dr. August Saucerean to discuss surgical options with possible outpatient arthroscopy with debridement, subacromial decompression and possible distal clavicle excision.  MRI reports that patient has mild degenerative changes of the acromioclavicular joint.  Patient complaining of pain and popping at the [facility] joint as well.  I did asked Dr. Casimiro NeedleMichael hilts to perform an ultrasound-guided acromioclavicular Marcaine/Depo-Medrol injection today.  Advised patient to pay close attention to how she feels after this is done.  She will follow-up with me next Wednesday and Dr. August Saucerean will be in clinic as well that day.  Follow-Up Instructions: Return in about 6 days (around 05/25/2019) for With Fayrene FearingJames recheck right shoulder and to discuss possible surgery with Dr. August Saucerean.   Orders:  No orders of the defined types were placed in this encounter.  No orders of the defined types were placed in this encounter.     Procedures: No procedures performed   Clinical Data: No additional findings.   Subjective: Chief Complaint  Patient presents with  . Right Shoulder - Pain, Follow-up    HPI 48 year old white female returns with complaints of right shoulder pain.  MRI scan from January 12, 2019 showed:  CLINICAL DATA:  Right shoulder pain since February, 2020 after hyperextending the arm doing a back flip. Pain is both anterior and posterior. No prior surgery or joint injections.  EXAM: MRI OF THE  RIGHT SHOULDER WITHOUT AND WITH CONTRAST  TECHNIQUE: Multiplanar, multisequence MR imaging of the RIGHT shoulder was performed before and after the administration of intravenous contrast.  CONTRAST:  10mL MULTIHANCE GADOBENATE DIMEGLUMINE 529 MG/ML IV SOLN  COMPARISON:  None.  FINDINGS: Rotator cuff: Tendinosis of the supraspinatus, infraspinatus and subscapularis tendons without frank tear or retraction. Intact teres minor.  Muscles:  No muscle atrophy or abnormal signal.  Biceps long head:  Intact and appropriately seated.  Acromioclavicular Joint: Mild arthropathy of the acromioclavicular joint. Type 3 hook shaped acromion which contributes to extrinsic impression on the myotendinous junction of the supraspinatus. Lateral acromial osteophyte along the undersurface, series 4/6. Subdeltoid bursal fluid is noted.  Glenohumeral Joint: No joint effusion.  Labrum: Grossly intact, but evaluation is limited by lack of intraarticular fluid.  Bones: Subcortical cystic change along the superolateral aspect of the humeral head may represent stigmata of impingement full-thickness cartilaginous fissuring.  Other: None  IMPRESSION: 1. Tendinosis of the supraspinatus, infraspinatus and subscapularis tendons without bursal or articular surfacing tear. 2. Intact glenoid labrum. 3. Shaped acromion with extrinsic impression on myotendinous junction of the supraspinatus. Subcortical cystic change of the humeral head may represent stigmata of chronic impingement.  I did see patient January 17, 2019 and I performed a right shoulder subacromial Marcaine/Depo-Medrol injection.  Patient states that this did give her fairly good relief for a couple of months.  She continues to have ongoing pain in the shoulder with overhead activity  and reaching on her back.  Also pain and popping around the [facility] joint with reaching across her body.  Not currently describing any cervical spine or  radicular component.  She comes in today stating that she is wanting to go ahead and schedule surgery for sometime in November.  I have spoken to Dr. Alphonzo Severance about patient's shoulder dilemma but she has not actually seen him as of yet.    Review of Systems No current cardiac pulmonary GI GU issues  Objective: Vital Signs: There were no vitals taken for this visit.  Physical Exam HENT:     Head: Normocephalic and atraumatic.  Eyes:     Extraocular Movements: Extraocular movements intact.     Pupils: Pupils are equal, round, and reactive to light.  Pulmonary:     Effort: Pulmonary effort is normal. No respiratory distress.  Musculoskeletal:     Comments: Server spine good range of motion.  She is mild right brachial plexus tenderness.  Right shoulder good range of motion.  Some soreness over the [facility] joint with cross body adduction.  Does move her also does cause some popping at the [facility] joint.  Positive impingement test.  Negative drop arm.  Pain with supraspinatus resistance.  Neurovascular intact.  Neurological:     General: No focal deficit present.     Mental Status: She is alert and oriented to person, place, and time.  Psychiatric:        Mood and Affect: Mood normal.     Ortho Exam  Specialty Comments:  No specialty comments available.  Imaging: No results found.   PMFS History: Patient Active Problem List   Diagnosis Date Noted  . DEPRESSION 12/09/2007  . MIGRAINE, CHRONIC 12/09/2007  . ESOPHAGITIS 12/09/2007  . GERD 12/09/2007  . PEPTIC ULCER DISEASE 12/09/2007  . DYSPEPSIA 12/09/2007  . DIARRHEA 12/09/2007  . CHOLELITHIASIS, HX OF 12/09/2007   Past Medical History:  Diagnosis Date  . Migraines     History reviewed. No pertinent family history.  Past Surgical History:  Procedure Laterality Date  . CHOLECYSTECTOMY    . PLACEMENT OF BREAST IMPLANTS     Social History   Occupational History  . Not on file  Tobacco Use  . Smoking status: Never  Smoker  . Smokeless tobacco: Never Used  Substance and Sexual Activity  . Alcohol use: Yes  . Drug use: No  . Sexual activity: Not on file

## 2019-05-25 ENCOUNTER — Ambulatory Visit: Payer: BC Managed Care – PPO | Admitting: Surgery

## 2019-06-03 ENCOUNTER — Ambulatory Visit: Payer: BC Managed Care – PPO | Admitting: Physical Therapy

## 2019-06-08 ENCOUNTER — Ambulatory Visit: Payer: BC Managed Care – PPO | Admitting: Surgery

## 2019-06-22 ENCOUNTER — Encounter: Payer: Self-pay | Admitting: Surgery

## 2019-06-22 ENCOUNTER — Ambulatory Visit (INDEPENDENT_AMBULATORY_CARE_PROVIDER_SITE_OTHER): Payer: BC Managed Care – PPO | Admitting: Surgery

## 2019-06-22 DIAGNOSIS — M7541 Impingement syndrome of right shoulder: Secondary | ICD-10-CM

## 2019-06-22 NOTE — Progress Notes (Signed)
Office Visit Note   Patient: Caitlin Martinez           Date of Birth: 1971-06-18           MRN: [id number] Visit Date: 06/22/2019              Requested by: Hulan Fess, MD [place],  [place] [address] PCP: Hulan Fess, MD   Assessment & Plan: Visit Diagnoses:  1. Impingement syndrome of right shoulder     Plan: At this point advised patient that I am basically at an end as to what I can offer her regards to her shoulder.  I will schedule appointment with Dr. Marlou Sa next week to discuss potential surgical options.  Advised patient that Dr. Marlou Sa may want to do a diagnostic/therapeutic right [facility] joint injection before scheduling surgery.  We discuss possible surgical options with right shoulder scope with debridement, SAD, DCE and possible rotator cuff repair.  She did not have a tear on the scan that was done March 2019 but I am concerned that she has possibly progressed to that. I advised her to avoid overhead weight training as this is aggravating her shoulder.  All questions answered.   Follow-Up Instructions: Return in about 2 weeks (around 07/06/2019) for WITH DR Marlou Sa TO DISCUSS RIGHT SHOULDER SURGERY.   Orders:  No orders of the defined types were placed in this encounter.  No orders of the defined types were placed in this encounter.     Procedures: No procedures performed   Clinical Data: No additional findings.   Subjective: Chief Complaint  Patient presents with   Right Shoulder - Follow-up    HPI 48 year old white female history of right shoulder impingement syndrome returns for recheck.  She continues have ongoing pain in her shoulder aggravated with overhead activities.  Last office visit with me May 19, 2019 I had sent her to Dr. Junius Roads for a ultrasound-guided right [facility] joint injection but then patient decided not to have it.  I had advised patient that I wanted her to have this done for diagnostic purposes before her appointment  with surgeon Dr. Marlou Sa to discuss surgical options that might include distal clavicle excision.  Patient stated that she did not want any more cortisone in her body.   Review of Systems No current cardiopulmonary GI GU issues  Objective: Vital Signs: There were no vitals taken for this visit.  Physical Exam HENT:     Head: Normocephalic and atraumatic.  Eyes:     Extraocular Movements: Extraocular movements intact.     Pupils: Pupils are equal, round, and reactive to light.  Musculoskeletal:     Comments: Exam right shoulder good range of motion.  Pain with impingement testing.  Pain with cross body adduction.  Tender over the [facility] joint.  Question some right supraspinatus atrophy with trace supraspinatus weakness.  Neurovascularly intact.  Neurological:     General: No focal deficit present.     Mental Status: She is oriented to person, place, and time.  Psychiatric:        Mood and Affect: Mood normal.     Ortho Exam  Specialty Comments:  No specialty comments available.  Imaging: No results found.   PMFS History: Patient Active Problem List   Diagnosis Date Noted   DEPRESSION 12/09/2007   MIGRAINE, CHRONIC 12/09/2007   ESOPHAGITIS 12/09/2007   GERD 12/09/2007   PEPTIC ULCER DISEASE 12/09/2007   DYSPEPSIA 12/09/2007   DIARRHEA 12/09/2007  CHOLELITHIASIS, HX OF 12/09/2007   Past Medical History:  Diagnosis Date   Migraines     No family history on file.  Past Surgical History:  Procedure Laterality Date   CHOLECYSTECTOMY     PLACEMENT OF BREAST IMPLANTS     Social History   Occupational History   Not on file  Tobacco Use   Smoking status: Never Smoker   Smokeless tobacco: Never Used  Substance and Sexual Activity   Alcohol use: Yes   Drug use: No   Sexual activity: Not on file

## 2019-07-06 ENCOUNTER — Ambulatory Visit (INDEPENDENT_AMBULATORY_CARE_PROVIDER_SITE_OTHER): Payer: BC Managed Care – PPO | Admitting: Orthopedic Surgery

## 2019-07-06 ENCOUNTER — Other Ambulatory Visit: Payer: Self-pay

## 2019-07-06 ENCOUNTER — Encounter: Payer: Self-pay | Admitting: Orthopedic Surgery

## 2019-07-06 DIAGNOSIS — M7541 Impingement syndrome of right shoulder: Secondary | ICD-10-CM

## 2019-07-06 NOTE — Progress Notes (Signed)
Office Visit Note   Patient: Caitlin Martinez           Date of Birth: Dec 06, 1970           MRN: [id number] Visit Date: 07/06/2019 Requested by: Catha GosselinLittle, Kevin, MD [address] [place],  [place] [id number] PCP: Catha GosselinLittle, Kevin, MD  Subjective: Chief Complaint  Patient presents with  . Right Shoulder - Pain    HPI: Caitlin Martinez is a patient with right shoulder pain.  Date of injury February 2020 when she was doing a flip and she may have hyperextended it.  She is right-hand dominant.  Had a subacromial injection in April and did well with that for several weeks.  Does report painful range of motion when she is trying to clean things.  She also likes to workout.  She is slightly concerned about doing another injection because of weight gain.  She works in [employer] but also likes to run for exercise              ROS: All systems reviewed are negative as they relate to the chief complaint within the history of present illness.  Patient denies  fevers or chills.   Assessment & Plan: Visit Diagnoses:  1. Impingement syndrome of right shoulder     Plan: Impression is right shoulder impingement.  Scan is reviewed and shows type I-II acromion.  I think she does have some bursitis and little tendinosis.  Labrum looks intact.  Impression is right shoulder impingement with good response to an injection several months ago.  She is had 6 months of symptoms and has significant pain particular with overhead motion.  She is responsible for her 48-year-old.  She is little concerned about options in terms of caring for him.  Discussed operative and nonoperative treatment options.  I think she was somewhat under the impression that it would be very easy to recover from shoulder surgery.  I think it would be easier in this case than most but not as easy as she may think it would be.  For that reason she may consider 1 injection before arthroscopy and subacromial decompression in November.  She will come  back in a couple weeks to get that done over a month before the anticipated surgery date. Follow-Up Instructions: Return if symptoms worsen or fail to improve.   Orders:  No orders of the defined types were placed in this encounter.  No orders of the defined types were placed in this encounter.     Procedures: No procedures performed   Clinical Data: No additional findings.  Objective: Vital Signs: There were no vitals taken for this visit.  Physical Exam:   Constitutional: Patient appears well-developed HEENT:  Head: Normocephalic Eyes:EOM are normal Neck: Normal range of motion Cardiovascular: Normal rate Pulmonary/chest: Effort normal Neurologic: Patient is alert Skin: Skin is warm Psychiatric: Patient has normal mood and affect    Ortho Exam: Ortho exam demonstrates full active and passive range of motion of the right shoulder.  She has excellent rotator cuff strength.  Palpable radial pulses.  Negative AC joint tenderness to direct palpation or with crossarm adduction.  Negative O'Brien's testing.  Equivocal impingement signs.  Not much in the way of coarse grinding or crepitus with active or passive range of motion or labral loading.  Specialty Comments:  No specialty comments available.  Imaging: No results found.   PMFS History: Patient Active Problem List   Diagnosis Date Noted  . DEPRESSION 12/09/2007  .  MIGRAINE, CHRONIC 12/09/2007  . ESOPHAGITIS 12/09/2007  . GERD 12/09/2007  . PEPTIC ULCER DISEASE 12/09/2007  . DYSPEPSIA 12/09/2007  . DIARRHEA 12/09/2007  . CHOLELITHIASIS, HX OF 12/09/2007   Past Medical History:  Diagnosis Date  . Migraines     History reviewed. No pertinent family history.  Past Surgical History:  Procedure Laterality Date  . CHOLECYSTECTOMY    . PLACEMENT OF BREAST IMPLANTS     Social History   Occupational History  . Not on file  Tobacco Use  . Smoking status: Never Smoker  . Smokeless tobacco: Never Used   Substance and Sexual Activity  . Alcohol use: Yes  . Drug use: No  . Sexual activity: Not on file

## 2019-08-22 DIAGNOSIS — G43009 Migraine without aura, not intractable, without status migrainosus: Secondary | ICD-10-CM | POA: Diagnosis not present

## 2019-08-22 DIAGNOSIS — M542 Cervicalgia: Secondary | ICD-10-CM | POA: Diagnosis not present

## 2020-02-20 DIAGNOSIS — G43009 Migraine without aura, not intractable, without status migrainosus: Secondary | ICD-10-CM | POA: Diagnosis not present

## 2020-02-20 DIAGNOSIS — M542 Cervicalgia: Secondary | ICD-10-CM | POA: Diagnosis not present

## 2020-02-28 DIAGNOSIS — Z01419 Encounter for gynecological examination (general) (routine) without abnormal findings: Secondary | ICD-10-CM | POA: Diagnosis not present

## 2020-02-28 DIAGNOSIS — Z124 Encounter for screening for malignant neoplasm of cervix: Secondary | ICD-10-CM | POA: Diagnosis not present

## 2020-02-28 DIAGNOSIS — Z6822 Body mass index (BMI) 22.0-22.9, adult: Secondary | ICD-10-CM | POA: Diagnosis not present

## 2020-08-22 DIAGNOSIS — G43009 Migraine without aura, not intractable, without status migrainosus: Secondary | ICD-10-CM | POA: Diagnosis not present

## 2020-08-24 DIAGNOSIS — R519 Headache, unspecified: Secondary | ICD-10-CM | POA: Diagnosis not present

## 2020-08-24 DIAGNOSIS — Z20822 Contact with and (suspected) exposure to covid-19: Secondary | ICD-10-CM | POA: Diagnosis not present

## 2020-09-20 ENCOUNTER — Encounter (HOSPITAL_COMMUNITY): Payer: Self-pay | Admitting: Emergency Medicine

## 2020-09-20 ENCOUNTER — Other Ambulatory Visit: Payer: Self-pay

## 2020-09-20 ENCOUNTER — Ambulatory Visit (HOSPITAL_COMMUNITY)
Admission: EM | Admit: 2020-09-20 | Discharge: 2020-09-20 | Disposition: A | Discharge status: Home / Self Care | Payer: BC Managed Care – PPO | Attending: Family Medicine | Admitting: Family Medicine

## 2020-09-20 ENCOUNTER — Ambulatory Visit (INDEPENDENT_AMBULATORY_CARE_PROVIDER_SITE_OTHER): Payer: BC Managed Care – PPO

## 2020-09-20 DIAGNOSIS — M79644 Pain in right finger(s): Secondary | ICD-10-CM | POA: Diagnosis not present

## 2020-09-20 DIAGNOSIS — S60051A Contusion of right little finger without damage to nail, initial encounter: Secondary | ICD-10-CM

## 2020-09-20 DIAGNOSIS — S67196A Crushing injury of right little finger, initial encounter: Secondary | ICD-10-CM | POA: Diagnosis not present

## 2020-09-20 DIAGNOSIS — S6991XA Unspecified injury of right wrist, hand and finger(s), initial encounter: Secondary | ICD-10-CM

## 2020-09-20 MED ORDER — TRAMADOL HCL 50 MG PO TABS: 50.0000 mg | ORAL_TABLET | Freq: Four times a day (QID) | ORAL | 0 refills | Status: DC | PRN

## 2020-09-20 MED ORDER — KETOROLAC TROMETHAMINE 60 MG/2ML IM SOLN
60.0000 mg | Freq: Once | INTRAMUSCULAR | Status: AC
  Administered 2020-09-20: 60 mg via INTRAMUSCULAR

## 2020-09-20 MED ORDER — KETOROLAC TROMETHAMINE 60 MG/2ML IM SOLN
INTRAMUSCULAR | Status: AC
  Filled 2020-09-20: qty 2

## 2020-09-20 NOTE — ED Triage Notes (Signed)
Right little finger injury.  Caught this finger tip in a car door.  Bruising and swelling to anterior side, patient had a abraised area to base of nail bed prior to this incident.  But injury caused this area to be larger than it was initially.  Patient has fake nails

## 2020-09-23 NOTE — ED Provider Notes (Signed)
[facility]    CSN: [id number] Arrival date & time: 09/20/20  8527      History   Chief Complaint Chief Complaint  Patient presents with  . Finger Injury    HPI Caitlin Martinez is a 49 y.o. female.   Patient presenting today with crush injury to right little finger after closing the tip of the finger into the car door about 2 hours ago. States significant pain, swelling, bruising in the area as well as some numbness. Able to move finger and remainder of hand. Has fake nails on all fingers. Has not tried anything other than ice so far.      Past Medical History:  Diagnosis Date  . Migraines     Patient Active Problem List   Diagnosis Date Noted  . DEPRESSION 12/09/2007  . MIGRAINE, CHRONIC 12/09/2007  . ESOPHAGITIS 12/09/2007  . GERD 12/09/2007  . PEPTIC ULCER DISEASE 12/09/2007  . DYSPEPSIA 12/09/2007  . DIARRHEA 12/09/2007  . CHOLELITHIASIS, HX OF 12/09/2007    Past Surgical History:  Procedure Laterality Date  . CHOLECYSTECTOMY    . PLACEMENT OF BREAST IMPLANTS      OB History   No obstetric history on file.      Home Medications    Prior to Admission medications   Medication Sig Start Date End Date Taking? Authorizing Provider  ALPRAZolam (XANAX) 0.25 MG tablet TAKE 1 TABLET BY MOUTH EVERY DAY AS NEEDED SLEEP 10/28/16   [provider]  Biotin 1 MG CAPS biotin    [provider]  carisoprodol (SOMA) 350 MG tablet Take 350 mg by mouth. 09/19/16   [provider]  Collagen 500 MG CAPS collagen    [provider]  Cyanocobalamin 2500 MCG SUBL Place under the tongue.    [provider]  estradiol (ESTRACE) 1 MG tablet Take 1 mg by mouth daily. 03/16/18   [provider]  Multiple Vitamins-Minerals (MULTIVITAMIN ADULT EXTRA C PO) multivitamin    [provider]  propranolol ER (INDERAL LA) 80 MG 24 hr capsule TAKE 1 CAP BY MOUTH DAILY--09/21/16 NEXT FILL-- 10/16/16    [provider]  traMADol (ULTRAM) 50 MG tablet Take 1 tablet (50 mg total) by mouth every 6 (six) hours as needed. 09/20/20   Particia Nearing, PA-C    Family History Family History  Problem Relation Age of Onset  . Hypertension Mother   . Diabetes Father   . Diabetes Brother     Social History Social History   Tobacco Use  . Smoking status: Never Smoker  . Smokeless tobacco: Never Used  Substance Use Topics  . Alcohol use: Yes  . Drug use: No     Allergies   Nsaids   Review of Systems Review of Systems PER HPI    Physical Exam Triage Vital Signs ED Triage Vitals  Enc Vitals Group     BP 09/20/20 1938 117/66     Pulse Rate 09/20/20 1938 69     Resp 09/20/20 1938 20     Temp 09/20/20 1938 97.7 F (36.5 C)     Temp Source 09/20/20 1938 Oral     SpO2 09/20/20 1938 100 %     Weight --      Height --      Head Circumference --      Peak Flow --      Pain Score 09/20/20 1935 7     Pain Loc --  Pain Edu? --      Excl. in GC? --    No data found.  Updated Vital Signs BP 117/66 (BP Location: Left Arm)   Pulse 69   Temp 97.7 F (36.5 C) (Oral)   Resp 20   SpO2 100%   Visual Acuity Right Eye Distance:   Left Eye Distance:   Bilateral Distance:    Right Eye Near:   Left Eye Near:    Bilateral Near:     Physical Exam Vitals and nursing note reviewed.  Constitutional:      Appearance: Normal appearance. She is not ill-appearing.  HENT:     Head: Atraumatic.  Eyes:     Extraocular Movements: Extraocular movements intact.     Conjunctiva/sclera: Conjunctivae normal.  Cardiovascular:     Rate and Rhythm: Normal rate and regular rhythm.     Pulses: Normal pulses.     Heart sounds: Normal heart sounds.  Pulmonary:     Effort: Pulmonary effort is normal.     Breath sounds: Normal breath sounds.  Musculoskeletal:        General: Swelling, tenderness and signs of injury present. No deformity.     Cervical back: Normal range of  motion and neck supple.     Comments: Decreased ROM to right distal little finger due to swelling  Skin:    General: Skin is warm and dry.     Findings: Bruising present.     Comments: Significant bruising and swelling to tip of right little finger, bleeding and abrasion to cuticle of same finger, no nail trauma to true nail and no bleeding  Neurological:     Mental Status: She is alert and oriented to person, place, and time.  Psychiatric:        Mood and Affect: Mood normal.        Thought Content: Thought content normal.        Judgment: Judgment normal.      UC Treatments / Results  Labs (all labs ordered are listed, but only abnormal results are displayed) Labs Reviewed - No data to display  EKG   Radiology No results found.  Procedures Procedures (including critical care time)  Medications Ordered in UC Medications  ketorolac (TORADOL) injection 60 mg (60 mg Intramuscular Given 09/20/20 2013)    Initial Impression / Assessment and Plan / UC Course  I have reviewed the triage vital signs and the nursing notes.  Pertinent labs & imaging results that were available during my care of the patient were reviewed by me and considered in my medical decision making (see chart for details).     X-ray right little finger showing no bony injury, IM toradol given in clinic for pain control, discussed tramadol for prn severe pain and OTC pain relievers, RICE, return precautions.   Final Clinical Impressions(s) / UC Diagnoses   Final diagnoses:  Contusion of right little finger without damage to nail, initial encounter   Discharge Instructions   None    ED Prescriptions    Medication Sig Dispense Auth. Provider   traMADol (ULTRAM) 50 MG tablet Take 1 tablet (50 mg total) by mouth every 6 (six) hours as needed. 15 tablet Particia Nearing, [place]     I have reviewed the PDMP during this encounter.   Roosvelt Maser [place], [place] 09/23/20 [phone]

## 2020-10-10 ENCOUNTER — Ambulatory Visit: Payer: Self-pay

## 2020-10-10 ENCOUNTER — Ambulatory Visit: Payer: BC Managed Care – PPO | Admitting: Surgery

## 2020-10-10 ENCOUNTER — Other Ambulatory Visit: Payer: Self-pay

## 2020-10-10 ENCOUNTER — Encounter: Payer: Self-pay | Admitting: Surgery

## 2020-10-10 DIAGNOSIS — M7541 Impingement syndrome of right shoulder: Secondary | ICD-10-CM | POA: Diagnosis not present

## 2020-10-10 NOTE — Progress Notes (Signed)
Office Visit Note   Patient: Caitlin Martinez           Date of Birth: Apr 13, 1971           MRN: [id number] Visit Date: 10/10/2020              Requested by: Catha Gosselin, MD [address] [place],  [place] [address] PCP: Catha Gosselin, MD   Assessment & Plan: Visit Diagnoses:  1. Impingement syndrome of right shoulder   Possible rotator cuff tear  Plan: Patient had requested repeat shoulder subacromial Marcaine/Depo-Medrol injection. I advised her that September 2020 when she was seen by Dr. August Saucer he had recommended surgical intervention of her right shoulder for chronic issues. Patient states that it is hard for her to have surgery because she is a single parent to her 27 year old son. I do have concerns that she has progressed to a full-thickness cuff tear since her last MRI over a year ago. Advised patient that I would hold off on repeating injection today and I did order MRI arthrogram. She will follow-up with Dr. August Saucer after completion of the study to discuss results and further treatment options. I will let him make decision at that time as to whether or not shoulder injection is indicated. Surgical intervention with shoulder arthroscopy with and without rotator cuff repair discussed along with potential rehab/recovery time for both. Patient states that she possibly would not be able to have surgery until next summer but may consider having it done sometime in January.  Follow-Up Instructions: Return in about 3 weeks (around 10/31/2020) for with dr dean to review right shoulder mri.   Orders:  Orders Placed This Encounter  Procedures  . XR Shoulder Right  . DG Arthro Shoulder Right  . MR SHOULDER RIGHT WO CONTRAST   No orders of the defined types were placed in this encounter.     Procedures: No procedures performed   Clinical Data: No additional findings.   Subjective: Chief Complaint  Patient presents with  . Right Shoulder - Pain     HPI 49 year old white female returns with complaints of right shoulder pain. I performed right shoulder subacromial Marcaine/Depo-Medrol injection March 2020 and this gave temporary improvement. Also ordered a right shoulder MRI which was done on 01/12/2019 and this showed:  EXAM: MRI OF THE RIGHT SHOULDER WITHOUT AND WITH CONTRAST  TECHNIQUE: Multiplanar, multisequence MR imaging of the RIGHT shoulder was performed before and after the administration of intravenous contrast.  CONTRAST:  1mL MULTIHANCE GADOBENATE DIMEGLUMINE 529 MG/ML IV SOLN  COMPARISON:  None.  FINDINGS: Rotator cuff: Tendinosis of the supraspinatus, infraspinatus and subscapularis tendons without frank tear or retraction. Intact teres minor.  Muscles:  No muscle atrophy or abnormal signal.  Biceps long head:  Intact and appropriately seated.  Acromioclavicular Joint: Mild arthropathy of the acromioclavicular joint. Type 3 hook shaped acromion which contributes to extrinsic impression on the myotendinous junction of the supraspinatus. Lateral acromial osteophyte along the undersurface, series 4/6. Subdeltoid bursal fluid is noted.  Glenohumeral Joint: No joint effusion.  Labrum: Grossly intact, but evaluation is limited by lack of intraarticular fluid.  Bones: Subcortical cystic change along the superolateral aspect of the humeral head may represent stigmata of impingement full-thickness cartilaginous fissuring.  Other: None  IMPRESSION: 1. Tendinosis of the supraspinatus, infraspinatus and subscapularis tendons without bursal or articular surfacing tear. 2. Intact glenoid labrum. 3. Shaped acromion with extrinsic impression on myotendinous junction of the supraspinatus. Subcortical cystic change of the humeral  head may represent stigmata of chronic impingement.  Referred patient to Dr. Dorene Grebe here in the office and she was seen by him September 2020 and he recommended surgical  intervention of the right shoulder. Patient states that she has been worried about having shoulder surgery and voices that she is a single parent to her 42 year old son and will be hard to care for him. She continues have pain with overhead activity's that are limiting to her. She feels like her shoulder is getting weak. Not describing any neck pain or cervical radicular component. States that she wants to have another shoulder injection.  Review of Systems No current cardiac pulmonary GI GU issues  Objective: Vital Signs: There were no vitals taken for this visit.  Physical Exam HENT:     Head: Normocephalic.  Pulmonary:     Effort: No respiratory distress.  Musculoskeletal:     Comments: Cervical spine unremarkable. Right shoulder she has good range of motion but with discomfort all planes. Negative drop arm test. Pain with impingement testing. Trace supraspinatus weakness with resistance. She does have slight deltoid atrophy on the right.  Neurological:     Mental Status: She is alert and oriented to person, place, and time.     Ortho Exam  Specialty Comments:  No specialty comments available.  Imaging: No results found.   PMFS History: Patient Active Problem List   Diagnosis Date Noted  . DEPRESSION 12/09/2007  . MIGRAINE, CHRONIC 12/09/2007  . ESOPHAGITIS 12/09/2007  . GERD 12/09/2007  . PEPTIC ULCER DISEASE 12/09/2007  . DYSPEPSIA 12/09/2007  . DIARRHEA 12/09/2007  . CHOLELITHIASIS, HX OF 12/09/2007   Past Medical History:  Diagnosis Date  . Migraines     Family History  Problem Relation Age of Onset  . Hypertension Mother   . Diabetes Father   . Diabetes Brother     Past Surgical History:  Procedure Laterality Date  . CHOLECYSTECTOMY    . PLACEMENT OF BREAST IMPLANTS     Social History   Occupational History  . Not on file  Tobacco Use  . Smoking status: Never Smoker  . Smokeless tobacco: Never Used  Substance and Sexual Activity  . Alcohol  use: Yes  . Drug use: No  . Sexual activity: Not on file

## 2020-10-17 DIAGNOSIS — Z20822 Contact with and (suspected) exposure to covid-19: Secondary | ICD-10-CM | POA: Diagnosis not present

## 2020-10-17 DIAGNOSIS — J101 Influenza due to other identified influenza virus with other respiratory manifestations: Secondary | ICD-10-CM | POA: Diagnosis not present

## 2020-10-17 DIAGNOSIS — J029 Acute pharyngitis, unspecified: Secondary | ICD-10-CM | POA: Diagnosis not present

## 2020-10-18 ENCOUNTER — Ambulatory Visit: Payer: BC Managed Care – PPO | Admitting: Surgery

## 2020-10-19 DIAGNOSIS — J101 Influenza due to other identified influenza virus with other respiratory manifestations: Secondary | ICD-10-CM | POA: Diagnosis not present

## 2020-10-19 DIAGNOSIS — R112 Nausea with vomiting, unspecified: Secondary | ICD-10-CM | POA: Diagnosis not present

## 2020-10-19 DIAGNOSIS — J029 Acute pharyngitis, unspecified: Secondary | ICD-10-CM | POA: Diagnosis not present

## 2020-10-19 DIAGNOSIS — R509 Fever, unspecified: Secondary | ICD-10-CM | POA: Diagnosis not present

## 2020-10-19 DIAGNOSIS — B349 Viral infection, unspecified: Secondary | ICD-10-CM | POA: Diagnosis not present

## 2020-10-28 DIAGNOSIS — R059 Cough, unspecified: Secondary | ICD-10-CM | POA: Diagnosis not present

## 2020-10-28 DIAGNOSIS — Z20822 Contact with and (suspected) exposure to covid-19: Secondary | ICD-10-CM | POA: Diagnosis not present

## 2020-10-28 DIAGNOSIS — R058 Other specified cough: Secondary | ICD-10-CM | POA: Diagnosis not present

## 2020-11-09 ENCOUNTER — Other Ambulatory Visit: Payer: Self-pay

## 2020-11-09 ENCOUNTER — Ambulatory Visit
Admission: RE | Admit: 2020-11-09 | Discharge: 2020-11-09 | Disposition: A | Discharge status: Home / Self Care | Payer: BC Managed Care – PPO | Source: Ambulatory Visit | Attending: Surgery | Admitting: Surgery

## 2020-11-09 DIAGNOSIS — M25511 Pain in right shoulder: Secondary | ICD-10-CM | POA: Diagnosis not present

## 2020-11-09 DIAGNOSIS — M75111 Incomplete rotator cuff tear or rupture of right shoulder, not specified as traumatic: Secondary | ICD-10-CM | POA: Diagnosis not present

## 2020-11-09 DIAGNOSIS — M7541 Impingement syndrome of right shoulder: Secondary | ICD-10-CM

## 2020-11-09 MED ORDER — IOPAMIDOL (ISOVUE-M 200) INJECTION 41%
12.0000 mL | Freq: Once | INTRAMUSCULAR | Status: AC
  Administered 2020-11-09: 12 mL via INTRA_ARTICULAR

## 2020-11-26 ENCOUNTER — Ambulatory Visit: Payer: BC Managed Care – PPO | Admitting: Surgical

## 2020-11-26 ENCOUNTER — Encounter: Payer: Self-pay | Admitting: Surgical

## 2020-11-26 DIAGNOSIS — M7541 Impingement syndrome of right shoulder: Secondary | ICD-10-CM | POA: Diagnosis not present

## 2020-11-26 DIAGNOSIS — M75101 Unspecified rotator cuff tear or rupture of right shoulder, not specified as traumatic: Secondary | ICD-10-CM | POA: Diagnosis not present

## 2020-11-26 MED ORDER — LIDOCAINE HCL 1 % IJ SOLN
5.0000 mL | INTRAMUSCULAR | Status: AC | PRN
  Administered 2020-11-26: 5 mL

## 2020-11-26 MED ORDER — BUPIVACAINE HCL 0.5 % IJ SOLN
9.0000 mL | INTRAMUSCULAR | Status: AC | PRN
  Administered 2020-11-26: 9 mL via INTRA_ARTICULAR

## 2020-11-26 MED ORDER — METHYLPREDNISOLONE ACETATE 40 MG/ML IJ SUSP
40.0000 mg | INTRAMUSCULAR | Status: AC | PRN
  Administered 2020-11-26: 40 mg via INTRA_ARTICULAR

## 2020-11-26 NOTE — Progress Notes (Signed)
Office Visit Note   Patient: Caitlin Martinez           Date of Birth: 12-Jul-1971           MRN: [id number] Visit Date: 11/26/2020 Requested by: Catha Gosselin, MD [address] [place],  [place] [address] PCP: Catha Gosselin, MD  Subjective: Chief Complaint  Patient presents with  . Other    Scan review    HPI: Caitlin Martinez is a 50 y.o. female who presents to the office complaining of right shoulder pain.  Patient has multiple years of right shoulder pain.  She has previously seen Zonia Kief, PA-C.  She had previous injection into her right shoulder in 2020 that provided excellent relief.  She was taking off her shirt and a dressing room in November 2021 when she felt sudden severe pain in her right shoulder.  She localizes pain to the anterior aspect of the right shoulder with radiation to the bicep.  She has subjective weakness of the right shoulder but no functional deficit, able to fully move her shoulder with no deficit and active range of motion compared with the left shoulder.  Pain is worse with lifting overhead and with forward flexion.  She enjoys working out to keep her arms toned which involves shoulder press, tricep extension, chest flies, posterior deltoid flies.  No history of surgery to the shoulder.  No history of physical therapy.  She is a single mother to a 48 year old son..                ROS: All systems reviewed are negative as they relate to the chief complaint within the history of present illness.  Patient denies fevers or chills.  Assessment & Plan: Visit Diagnoses:  1. Tear of right supraspinatus tendon   2. Impingement syndrome of right shoulder     Plan: Patient is a 50 year old female presents complaining of right shoulder pain.  She has had worsening pain in her shoulder since taking off her shirt and a dressing room in November 2021.  She saw Zonia Kief, PA-C who felt with her progression from her previous MRI, he would order  MRI arthrogram for suspicion of a full-thickness tear.  She does have a partial-thickness small tear of the supraspinatus with a possible nondisplaced SLAP tear.  This would correlate with the bicep symptoms that she is experiencing and the objective findings on exam.  She has well-preserved range of motion and fairly good strength with only mild weakness that is probably secondary to pain.  No crepitus is felt with passive range of motion of the shoulder.  Plan to try subacromial injection today with physical therapy and follow-up in 2 months for clinical recheck.  Consider potential for operative management at that time if no significant improvement.  Follow-Up Instructions: No follow-ups on file.   Orders:  No orders of the defined types were placed in this encounter.  No orders of the defined types were placed in this encounter.     Procedures: Large Joint Inj: R subacromial bursa on 11/26/2020 8:45 AM Indications: diagnostic evaluation and pain Details: 18 G 1.5 in needle, posterior approach  Arthrogram: No  Medications: 9 mL bupivacaine 0.5 %; 40 mg methylPREDNISolone acetate 40 MG/ML; 5 mL lidocaine 1 % Outcome: tolerated well, no immediate complications Procedure, treatment alternatives, risks and benefits explained, specific risks discussed. Consent was given by the patient. Immediately prior to procedure a time out was called to verify the correct patient, procedure,  equipment, support staff and site/side marked as required. Patient was prepped and draped in the usual sterile fashion.       Clinical Data: No additional findings.  Objective: Vital Signs: There were no vitals taken for this visit.  Physical Exam:  Constitutional: Patient appears well-developed HEENT:  Head: Normocephalic Eyes:EOM are normal Neck: Normal range of motion Cardiovascular: Normal rate Pulmonary/chest: Effort normal Neurologic: Patient is alert Skin: Skin is warm Psychiatric: Patient has  normal mood and affect  Ortho Exam: Ortho exam demonstrates right shoulder with 80 degrees external rotation, 140 degrees abduction, 180 degrees forward flexion.  This is compared with left shoulder with 80 degrees external rotation, 130 degrees abduction, 180 degrees forward flexion.  Right shoulder with 5 -/5 strength of supraspinatus and infraspinatus and 5/5 strength of subscapularis.  Moderate tenderness over the bicipital groove.  Positive O'Brien sign.  No tenderness over the bicep muscle belly.  Negative Neer's and Hawkins impingement signs.  No tenderness over the [facility] joint.  Excellent strength rated at 5/5 of grip strength, finger abduction, pronation/supination, bicep, tricep, deltoid.  No tenderness of the axial cervical spine.  Specialty Comments:  No specialty comments available.  Imaging: No results found.   PMFS History: Patient Active Problem List   Diagnosis Date Noted  . DEPRESSION 12/09/2007  . MIGRAINE, CHRONIC 12/09/2007  . ESOPHAGITIS 12/09/2007  . GERD 12/09/2007  . PEPTIC ULCER DISEASE 12/09/2007  . DYSPEPSIA 12/09/2007  . DIARRHEA 12/09/2007  . CHOLELITHIASIS, HX OF 12/09/2007   Past Medical History:  Diagnosis Date  . Migraines     Family History  Problem Relation Age of Onset  . Hypertension Mother   . Diabetes Father   . Diabetes Brother     Past Surgical History:  Procedure Laterality Date  . CHOLECYSTECTOMY    . PLACEMENT OF BREAST IMPLANTS     Social History   Occupational History  . Not on file  Tobacco Use  . Smoking status: Never Smoker  . Smokeless tobacco: Never Used  Substance and Sexual Activity  . Alcohol use: Yes  . Drug use: No  . Sexual activity: Not on file

## 2020-12-06 ENCOUNTER — Telehealth: Payer: Self-pay | Admitting: Surgical

## 2020-12-06 DIAGNOSIS — G8929 Other chronic pain: Secondary | ICD-10-CM | POA: Diagnosis not present

## 2020-12-06 DIAGNOSIS — M25511 Pain in right shoulder: Secondary | ICD-10-CM | POA: Diagnosis not present

## 2020-12-06 DIAGNOSIS — Z7409 Other reduced mobility: Secondary | ICD-10-CM | POA: Diagnosis not present

## 2020-12-06 NOTE — Telephone Encounter (Signed)
Genie with PT called stating the order that was sent for PT on the pts shoulder, was missing the DOB. Genie would like to have this corrected and refaxed along with a demographic sheet.  CB# [phone] Fax# [phone]

## 2020-12-10 NOTE — Telephone Encounter (Signed)
faxed

## 2020-12-11 DIAGNOSIS — M25511 Pain in right shoulder: Secondary | ICD-10-CM | POA: Diagnosis not present

## 2020-12-11 DIAGNOSIS — G8929 Other chronic pain: Secondary | ICD-10-CM | POA: Diagnosis not present

## 2020-12-11 DIAGNOSIS — Z7409 Other reduced mobility: Secondary | ICD-10-CM | POA: Diagnosis not present

## 2020-12-20 DIAGNOSIS — Z7409 Other reduced mobility: Secondary | ICD-10-CM | POA: Diagnosis not present

## 2020-12-20 DIAGNOSIS — M25511 Pain in right shoulder: Secondary | ICD-10-CM | POA: Diagnosis not present

## 2020-12-20 DIAGNOSIS — G8929 Other chronic pain: Secondary | ICD-10-CM | POA: Diagnosis not present

## 2020-12-27 DIAGNOSIS — Z7409 Other reduced mobility: Secondary | ICD-10-CM | POA: Diagnosis not present

## 2020-12-27 DIAGNOSIS — G8929 Other chronic pain: Secondary | ICD-10-CM | POA: Diagnosis not present

## 2020-12-27 DIAGNOSIS — M25511 Pain in right shoulder: Secondary | ICD-10-CM | POA: Diagnosis not present

## 2021-01-03 DIAGNOSIS — M25511 Pain in right shoulder: Secondary | ICD-10-CM | POA: Diagnosis not present

## 2021-01-03 DIAGNOSIS — Z7409 Other reduced mobility: Secondary | ICD-10-CM | POA: Diagnosis not present

## 2021-01-03 DIAGNOSIS — G8929 Other chronic pain: Secondary | ICD-10-CM | POA: Diagnosis not present

## 2021-01-10 DIAGNOSIS — M25511 Pain in right shoulder: Secondary | ICD-10-CM | POA: Diagnosis not present

## 2021-01-10 DIAGNOSIS — Z7409 Other reduced mobility: Secondary | ICD-10-CM | POA: Diagnosis not present

## 2021-01-10 DIAGNOSIS — G8929 Other chronic pain: Secondary | ICD-10-CM | POA: Diagnosis not present

## 2021-01-17 DIAGNOSIS — Z7409 Other reduced mobility: Secondary | ICD-10-CM | POA: Diagnosis not present

## 2021-01-17 DIAGNOSIS — G8929 Other chronic pain: Secondary | ICD-10-CM | POA: Diagnosis not present

## 2021-01-17 DIAGNOSIS — M25511 Pain in right shoulder: Secondary | ICD-10-CM | POA: Diagnosis not present

## 2021-01-22 DIAGNOSIS — G8929 Other chronic pain: Secondary | ICD-10-CM | POA: Diagnosis not present

## 2021-01-22 DIAGNOSIS — Z7409 Other reduced mobility: Secondary | ICD-10-CM | POA: Diagnosis not present

## 2021-01-22 DIAGNOSIS — M25511 Pain in right shoulder: Secondary | ICD-10-CM | POA: Diagnosis not present

## 2021-01-24 ENCOUNTER — Ambulatory Visit: Payer: BC Managed Care – PPO | Admitting: Orthopedic Surgery

## 2021-01-24 ENCOUNTER — Other Ambulatory Visit: Payer: Self-pay

## 2021-01-24 DIAGNOSIS — M75101 Unspecified rotator cuff tear or rupture of right shoulder, not specified as traumatic: Secondary | ICD-10-CM | POA: Diagnosis not present

## 2021-01-26 ENCOUNTER — Encounter: Payer: Self-pay | Admitting: Orthopedic Surgery

## 2021-01-26 NOTE — Progress Notes (Signed)
Office Visit Note   Patient: Caitlin Martinez           Date of Birth: 03-24-1971           MRN: [id number] Visit Date: 01/24/2021 Requested by: Catha Gosselin, MD [address] [place],  [place] [address] PCP: Catha Gosselin, MD  Subjective: Chief Complaint  Patient presents with  . Right Shoulder - Follow-up    HPI: Caitlin Martinez is a 50 y.o. female who presents to the office complaining of right shoulder pain.  She returns to discuss her right shoulder symptoms.  She notes very mild improvement but nothing significant.  She has been trying physical therapy after having a subacromial injection at her last office visit.  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  She is a single mother who has an 32 year old son at home with her.  She does note her range of motion feels like it improved with therapy but she notes continued decreased strength of the right arm and continued pain with most activities she tries to do.  She continues to wake up with pain at night.  She has not been able to return to the gym where she enjoys working out and doing some Run[employer], broadcasting/film/video.              ROS: All systems reviewed are negative as they relate to the chief complaint within the history of present illness.  Patient denies fevers or chills.  Assessment & Plan: Visit Diagnoses:  1. Tear of right supraspinatus tendon     Plan: Patient is a 50 year old female who presents complaining of right shoulder pain.  She notes no significant improvement with her subacromial injection and course of physical therapy over the last several months.  She has not been able to return to the activities she likes to do and she continues to wake with pain.  She has MRI scan of the right shoulder that was reviewed at her last office visit and revealed likely nondisplaced SLAP tear and partial-thickness articular surface tearing of the anterior supraspinatus.  Discussed the options available to patient including  living as she is now, continue with physical therapy, surgical management.  Discussed the risks and benefits of surgery and what surgery would entail as well as an extensive conversation on the recovery timeline from the surgery.  After lengthy discussion, patient wishes to proceed with surgery but wants to hold off until the summer due to her not having much help at home and having to take care of her 90 year old son by herself.  This is reasonable and plan to post patient for right shoulder arthroscopy with biceps tenodesis and mini open rotator cuff tear repair in summer 2022.  Follow-up after procedure.  Risk and benefits are discussed include not limited to infection nerve vessel damage shoulder stiffness as well as the expected rehabilitation time course.  All questions answered.  Follow-Up Instructions: No follow-ups on file.   Orders:  No orders of the defined types were placed in this encounter.  No orders of the defined types were placed in this encounter.     Procedures: No procedures performed   Clinical Data: No additional findings.  Objective: Vital Signs: There were no vitals taken for this visit.  Physical Exam:  Constitutional: Patient appears well-developed HEENT:  Head: Normocephalic Eyes:EOM are normal Neck: Normal range of motion Cardiovascular: Normal rate Pulmonary/chest: Effort normal Neurologic: Patient is alert Skin: Skin is warm Psychiatric: Patient has normal mood and affect  Ortho Exam: Ortho exam demonstrates right shoulder with 50 degrees external rotation, 100 degrees abduction, 170 degrees forward flexion.  She has excellent strength of the supraspinatus, infraspinatus, subscapularis of the right shoulder where she does have increased pain with supraspinatus and infraspinatus strength testing.  She has tenderness particularly over the bicipital groove and a positive O'Brien sign.  No tenderness over the [facility] joint.  No tenderness over the axial cervical  spine.  Negative Spurling sign.  Negative Hornblower sign.  Negative drop arm test.  Full active range of motion which is equivalent to passive motion.  Specialty Comments:  No specialty comments available.  Imaging: No results found.   PMFS History: Patient Active Problem List   Diagnosis Date Noted  . DEPRESSION 12/09/2007  . MIGRAINE, CHRONIC 12/09/2007  . ESOPHAGITIS 12/09/2007  . GERD 12/09/2007  . PEPTIC ULCER DISEASE 12/09/2007  . DYSPEPSIA 12/09/2007  . DIARRHEA 12/09/2007  . CHOLELITHIASIS, HX OF 12/09/2007   Past Medical History:  Diagnosis Date  . Migraines     Family History  Problem Relation Age of Onset  . Hypertension Mother   . Diabetes Father   . Diabetes Brother     Past Surgical History:  Procedure Laterality Date  . CHOLECYSTECTOMY    . PLACEMENT OF BREAST IMPLANTS     Social History   Occupational History  . Not on file  Tobacco Use  . Smoking status: Never Smoker  . Smokeless tobacco: Never Used  Substance and Sexual Activity  . Alcohol use: Yes  . Drug use: No  . Sexual activity: Not on file

## 2021-02-20 ENCOUNTER — Telehealth: Payer: Self-pay | Admitting: Orthopedic Surgery

## 2021-02-20 DIAGNOSIS — G43909 Migraine, unspecified, not intractable, without status migrainosus: Secondary | ICD-10-CM | POA: Diagnosis not present

## 2021-02-20 NOTE — Telephone Encounter (Signed)
Received $25.00 check, medical records release form and disability/FMLA forms from patient    Forwarding to [facility] today

## 2021-04-05 ENCOUNTER — Telehealth: Payer: Self-pay

## 2021-04-05 NOTE — Telephone Encounter (Signed)
Patient called she is requesting her medications for surgery ahead of time if possible due to living alone, patient also wants to get her machine early just so she can have everything set up  and ready for when the time comes call [phone]

## 2021-04-10 DIAGNOSIS — J0100 Acute maxillary sinusitis, unspecified: Secondary | ICD-10-CM | POA: Diagnosis not present

## 2021-04-10 DIAGNOSIS — H6122 Impacted cerumen, left ear: Secondary | ICD-10-CM | POA: Diagnosis not present

## 2021-04-12 ENCOUNTER — Telehealth: Payer: Self-pay | Admitting: Orthopedic Surgery

## 2021-04-12 NOTE — Telephone Encounter (Signed)
Patient left message on voice mail asking for her pain medication in advance of her surgery, as in the Friday before.  Patient uses [facility] [employer] on [address]. Also patient would like the CPM delivered prior to surgery.  I have given patient [employer]'s number to contact him directly. She states she feels anxious because she will be by herself during some of her recovery.  Regarding the CPM machine, I explained the machines are delivered much closer to the surgery date. She may call Kipp Brood to discuss earlier date.  Please let patient know if there is an issue with requesting Rx on Friday before surgery.  She said she left a message earlier in the week but hasn't heard from anyone.    Surgery is posted for July 18th at [facility] at 7:30am  for Right shoulder arthroscopy, debridement, possible mini open rotator cuff tear repair, biceps tenodesis.

## 2021-04-15 NOTE — Telephone Encounter (Signed)
I will send medicine in, but leaving message up to remind me to send on Friday prior to surgery. Recommend she call on that Thursday prior for reminder

## 2021-04-24 ENCOUNTER — Other Ambulatory Visit: Payer: Self-pay | Admitting: Surgical

## 2021-04-24 MED ORDER — METHOCARBAMOL 500 MG PO TABS: 500.0000 mg | ORAL_TABLET | Freq: Three times a day (TID) | ORAL | 0 refills | Status: DC | PRN

## 2021-04-24 MED ORDER — OXYCODONE-ACETAMINOPHEN 5-325 MG PO TABS: 1.0000 | ORAL_TABLET | ORAL | 0 refills | Status: DC | PRN

## 2021-04-24 NOTE — Telephone Encounter (Signed)
Sent in meds, dont take prior to surgery. Tahnks

## 2021-04-25 NOTE — Telephone Encounter (Signed)
Notified pt. 

## 2021-05-05 ENCOUNTER — Other Ambulatory Visit: Payer: Self-pay | Admitting: Orthopedic Surgery

## 2021-05-05 MED ORDER — OXYCODONE-ACETAMINOPHEN 5-325 MG PO TABS: 1.0000 | ORAL_TABLET | ORAL | 0 refills | Status: AC | PRN

## 2021-05-05 MED ORDER — METHOCARBAMOL 500 MG PO TABS: 500.0000 mg | ORAL_TABLET | Freq: Three times a day (TID) | ORAL | 0 refills | Status: DC | PRN

## 2021-05-06 ENCOUNTER — Encounter: Payer: Self-pay | Admitting: Orthopedic Surgery

## 2021-05-06 DIAGNOSIS — M7521 Bicipital tendinitis, right shoulder: Secondary | ICD-10-CM | POA: Diagnosis not present

## 2021-05-06 DIAGNOSIS — M659 Synovitis and tenosynovitis, unspecified: Secondary | ICD-10-CM | POA: Diagnosis not present

## 2021-05-06 DIAGNOSIS — S43431A Superior glenoid labrum lesion of right shoulder, initial encounter: Secondary | ICD-10-CM | POA: Diagnosis not present

## 2021-05-06 DIAGNOSIS — X58XXXA Exposure to other specified factors, initial encounter: Secondary | ICD-10-CM | POA: Diagnosis not present

## 2021-05-06 DIAGNOSIS — M25511 Pain in right shoulder: Secondary | ICD-10-CM | POA: Diagnosis not present

## 2021-05-06 DIAGNOSIS — M75101 Unspecified rotator cuff tear or rupture of right shoulder, not specified as traumatic: Secondary | ICD-10-CM | POA: Diagnosis not present

## 2021-05-06 DIAGNOSIS — M25311 Other instability, right shoulder: Secondary | ICD-10-CM | POA: Diagnosis not present

## 2021-05-06 DIAGNOSIS — Y999 Unspecified external cause status: Secondary | ICD-10-CM | POA: Diagnosis not present

## 2021-05-06 DIAGNOSIS — M6281 Muscle weakness (generalized): Secondary | ICD-10-CM | POA: Diagnosis not present

## 2021-05-06 DIAGNOSIS — M25611 Stiffness of right shoulder, not elsewhere classified: Secondary | ICD-10-CM | POA: Diagnosis not present

## 2021-05-06 DIAGNOSIS — M75121 Complete rotator cuff tear or rupture of right shoulder, not specified as traumatic: Secondary | ICD-10-CM | POA: Diagnosis not present

## 2021-05-06 DIAGNOSIS — G8918 Other acute postprocedural pain: Secondary | ICD-10-CM | POA: Diagnosis not present

## 2021-05-13 ENCOUNTER — Other Ambulatory Visit: Payer: Self-pay

## 2021-05-13 ENCOUNTER — Ambulatory Visit (INDEPENDENT_AMBULATORY_CARE_PROVIDER_SITE_OTHER): Payer: BC Managed Care – PPO | Admitting: Orthopedic Surgery

## 2021-05-13 ENCOUNTER — Encounter: Payer: Self-pay | Admitting: Orthopedic Surgery

## 2021-05-13 DIAGNOSIS — M75101 Unspecified rotator cuff tear or rupture of right shoulder, not specified as traumatic: Secondary | ICD-10-CM

## 2021-05-13 NOTE — Progress Notes (Signed)
   Post-Op Visit Note   Patient: Caitlin Martinez           Date of Birth: 02/06/1971           MRN: [id number] Visit Date: 05/13/2021 PCP: Catha Gosselin, MD   Assessment & Plan:  Chief Complaint:  Chief Complaint  Patient presents with   Right Shoulder - Routine Post Op   Visit Diagnoses:  1. Tear of right supraspinatus tendon     Plan: Patient is a 51 year old female who presents s/p right shoulder rotator cuff repair on 05/06/2021.  She is doing well overall.  Only has to take pain medicine about once at night.  She is sleeping okay at night in bed and sleeps for about 4 hours at a time which is compared with 5 to 6 hours when she is at her baseline.  She is using her sling and not lift anything with the arm as instructed.  She is using CPM machine and is up to 77 degrees on the machine with intention to continue to progress and go to 120.  Does note some left trapezius muscle soreness but that is her only main complaint.  On exam she has incisions that are healing well and sutures were removed and replaced with Steri-Strips.  Actually nerve intact with deltoid firing.  Radial nerve intact and 2+ radial pulse.  No sign of infection or dehiscence of the incisions.  Range of motion shows 10 degrees external rotation, 60 degrees abduction, 70 degrees forward flexion.  Plan to continue with CPM machine and follow-up in 2 weeks for clinical recheck with Dr. August Saucer.  Initiate physical therapy at that time to focus on passive and active assisted range of motion with rotator cuff strengthening and full active range of motion to start 6 weeks out from surgery on 06/17/2021.  Follow-Up Instructions: No follow-ups on file.   Orders:  No orders of the defined types were placed in this encounter.  No orders of the defined types were placed in this encounter.   Imaging: No results found.  PMFS History: Patient Active Problem List   Diagnosis Date Noted   DEPRESSION 12/09/2007    MIGRAINE, CHRONIC 12/09/2007   ESOPHAGITIS 12/09/2007   GERD 12/09/2007   PEPTIC ULCER DISEASE 12/09/2007   DYSPEPSIA 12/09/2007   DIARRHEA 12/09/2007   CHOLELITHIASIS, HX OF 12/09/2007   Past Medical History:  Diagnosis Date   Migraines     Family History  Problem Relation Age of Onset   Hypertension Mother    Diabetes Father    Diabetes Brother     Past Surgical History:  Procedure Laterality Date   CHOLECYSTECTOMY     PLACEMENT OF BREAST IMPLANTS     Social History   Occupational History   Not on file  Tobacco Use   Smoking status: Never   Smokeless tobacco: Never  Substance and Sexual Activity   Alcohol use: Yes   Drug use: No   Sexual activity: Not on file

## 2021-05-27 ENCOUNTER — Encounter: Payer: Self-pay | Admitting: Orthopedic Surgery

## 2021-05-27 ENCOUNTER — Ambulatory Visit (INDEPENDENT_AMBULATORY_CARE_PROVIDER_SITE_OTHER): Payer: BC Managed Care – PPO | Admitting: Orthopedic Surgery

## 2021-05-27 ENCOUNTER — Other Ambulatory Visit: Payer: Self-pay

## 2021-05-27 DIAGNOSIS — M75101 Unspecified rotator cuff tear or rupture of right shoulder, not specified as traumatic: Secondary | ICD-10-CM

## 2021-05-27 DIAGNOSIS — M25511 Pain in right shoulder: Secondary | ICD-10-CM | POA: Diagnosis not present

## 2021-05-27 DIAGNOSIS — M6281 Muscle weakness (generalized): Secondary | ICD-10-CM | POA: Diagnosis not present

## 2021-05-27 DIAGNOSIS — M25611 Stiffness of right shoulder, not elsewhere classified: Secondary | ICD-10-CM | POA: Diagnosis not present

## 2021-05-29 ENCOUNTER — Encounter (HOSPITAL_BASED_OUTPATIENT_CLINIC_OR_DEPARTMENT_OTHER): Payer: Self-pay | Admitting: Physical Therapy

## 2021-05-29 ENCOUNTER — Other Ambulatory Visit: Payer: Self-pay

## 2021-05-29 ENCOUNTER — Ambulatory Visit (HOSPITAL_BASED_OUTPATIENT_CLINIC_OR_DEPARTMENT_OTHER): Payer: BC Managed Care – PPO | Attending: Orthopedic Surgery | Admitting: Physical Therapy

## 2021-05-29 DIAGNOSIS — M6281 Muscle weakness (generalized): Secondary | ICD-10-CM | POA: Diagnosis not present

## 2021-05-29 DIAGNOSIS — M25511 Pain in right shoulder: Secondary | ICD-10-CM | POA: Diagnosis not present

## 2021-05-29 DIAGNOSIS — M25611 Stiffness of right shoulder, not elsewhere classified: Secondary | ICD-10-CM | POA: Insufficient documentation

## 2021-05-29 NOTE — Therapy (Signed)
[facility] [address] [place], [place], [address] Phone: [phone]   Fax:  [phone]  Physical Therapy Evaluation  Patient Details  Name: Caitlin Martinez MRN: [id number] Date of Birth: Mar 18, 1971 Referring Provider (PT): Cammy Copa MD   Encounter Date: 05/29/2021   PT End of Session - 05/29/21 0916     Visit Number 1    Number of Visits 12    Date for PT Re-Evaluation 07/10/21    Authorization Type BCBS 2022 52 visits    PT Start Time 0848    PT Stop Time 0930    PT Time Calculation (min) 42 min    Activity Tolerance Patient tolerated treatment well    Behavior During Therapy [facility] for tasks assessed/performed             Past Medical History:  Diagnosis Date   Migraines     Past Surgical History:  Procedure Laterality Date   CHOLECYSTECTOMY     PLACEMENT OF BREAST IMPLANTS      There were no vitals filed for this visit.    Subjective Assessment - 05/29/21 0852     Subjective Patient reports pain during visit that is more intermittent. She is eager to start PT. Patient states that    Pertinent History 3 weeks out of surgery    Limitations Lifting;House hold activities    How long can you sit comfortably? N/A    How long can you stand comfortably? N/A    How long can you walk comfortably? N/A    Diagnostic tests nothing post-op    Patient Stated Goals to return to active lifestyle without restrictions    Currently in Pain? Yes    Pain Location Shoulder    Pain Orientation Right    Pain Descriptors / Indicators Aching    Pain Type Surgical pain    Pain Onset 1 to 4 weeks ago    Pain Frequency Intermittent    Aggravating Factors  Use of arms.    Pain Relieving Factors medication    Effect of Pain on Daily Activities difficulty perfroming ADL's  and using right UE    Multiple Pain Sites No                [facility] PT Assessment - 05/29/21 0001       Assessment    Medical Diagnosis right RTC repair and Bicpes Tenodesis    Referring Provider (PT) Cammy Copa MD    Onset Date/Surgical Date 05/06/21    Hand Dominance Right    Next MD Visit 3 weeks    Prior Therapy Had therapy prior to the surgery without significant improvement      Precautions   Precautions None      Restrictions   Weight Bearing Restrictions No      Balance Screen   Has the patient fallen in the past 6 months No    Has the patient had a decrease in activity level because of a fear of falling?  No    Is the patient reluctant to leave their home because of a fear of falling?  No      Home Environment   Additional Comments Nothing pertinant      Prior Function   Level of Independence Independent    Vocation Full time employment    Vocation Requirements sells [employer]    Leisure running, going to the gym      Cognition   Overall Cognitive  Status Within Functional Limits for tasks assessed    Attention Focused    Focused Attention Appears intact    Memory Appears intact    Awareness Appears intact    Problem Solving Appears intact      Observation/Other Assessments   Observations Paer Patient MD will alow her in the pool in the next week or two.    Skin Integrity well healed surgical ports      Sensation   Additional Comments numbenss into the last 2 fingers      Coordination   Gross Motor Movements are Fluid and Coordinated Yes    Fine Motor Movements are Fluid and Coordinated Yes      ROM / Strength   AROM / PROM / Strength Strength;PROM;AROM      AROM   Overall AROM Comments not tested 2nd to recent surgery      PROM   Overall PROM Comments PROM past expected range for time frame.    PROM Assessment Site Shoulder    Right/Left Shoulder Right    Right Shoulder Flexion 104 Degrees    Right Shoulder External Rotation 37 Degrees   without pushing to end feel     Strength   Overall Strength Unable to assess    Overall Strength Comments not  tested 2nd to recent surgery    Strength Assessment Site Shoulder    Right/Left Shoulder Right                        Objective measurements completed on examination: See above findings.       [facility] Adult PT Treatment/Exercise - 05/29/21 0001       Shoulder Exercises: Supine   Other Supine Exercises Patient will be going on vacation for a week. We generally wouldn't start AAROM until week 4 but she will be on vacation. She was shown how to do light ER with cane but she was advised nto to push into ned range, just to use it to keep her ROM.      Shoulder Exercises: Standing   Other Standing Exercises Pendulums in all plains    Other Standing Exercises seated scap retraction 3x10      Manual Therapy   Manual Therapy Soft tissue mobilization;Passive ROM    Soft tissue mobilization to upper trap    Passive ROM into ER /IR and flexion with light distraction                    PT Education - 05/29/21 1052     Education Details HEP and symptom management    Person(s) Educated Patient    Methods Explanation;Demonstration;Tactile cues;Verbal cues    Comprehension Verbalized understanding;Returned demonstration;Verbal cues required;Tactile cues required              PT Short Term Goals - 05/29/21 1037       PT SHORT TERM GOAL #1   Title Patient will increase passive ER to 75 degrees    Time 3    Period Weeks    Status New    Target Date 06/19/21      PT SHORT TERM GOAL #2   Title Patient will incrrease passive shoulder flexion to 120 degrees    Time 3    Period Weeks    Status New    Target Date 06/19/21      PT SHORT TERM GOAL #3   Title Patient will be indepdnent with a base scpaular strengthening  and isomtric program    Time 3    Period Weeks    Status New    Target Date 06/19/21               PT Long Term Goals - 05/29/21 1044       PT LONG TERM GOAL #1   Title Patient will reach overhead to a shelf without increased pain     Time 8    Period Weeks    Status New    Target Date 07/24/21      PT LONG TERM GOAL #2   Title Patient will reach behind her head to do her hair without pain    Time 8    Period Weeks    Status New    Target Date 07/24/21      PT LONG TERM GOAL #3   Title Patient will reach behind her back to L4 without pain in order tp perfrom self care tasks    Time 8    Period Weeks    Status New    Target Date 07/24/21                    Plan - 05/29/21 0916     Clinical Impression Statement Patient is a 61 year year old female S/P right RTC repair and bicpes tenosesis on 05/06/2021. She presents with expected limitations in motion, strength, and fucntion in her dominalt arm. She has intemrittent numbness in following an ulnar distrabution but this is not frequent. Her pain is well controlled. She has mild spasming in her right upper trap. She is currently in a simple sling but is allowed to progress out per MD. She would benefit from skilled therapy therapy to improve fucntional use of her domiant hand. She is very active at baseline. She was educated on normal tissue healing time.    Personal Factors and Comorbidities Comorbidity 1    Comorbidities migranes    Examination-Activity Limitations Bathing;Carry;Bed Mobility;Reach Overhead;Lift    Examination-Participation Restrictions Community Activity;Laundry;Yard Work;Volunteer;Cleaning    Stability/Clinical Decision Making Stable/Uncomplicated    Clinical Decision Making Low    Rehab Potential Excellent    PT Frequency 2x / week    PT Duration 8 weeks    PT Treatment/Interventions ADLs/Self Care Home Management;Aquatic Therapy;Cryotherapy;Moist Heat;Functional mobility training;Therapeutic activities;Therapeutic exercise;Neuromuscular re-education;Patient/family education;Scar mobilization;Passive range of motion;Energy conservation;Dry needling;Splinting;Taping;Vasopneumatic Device;Joint Manipulations    PT Next Visit Plan Continue  with improving range of motion in shoulder flexion and shoulder external rotation. Progress to AAROM at 4 weeks. Propress to Isomtrics and scpaular strengthening at 5-6 weeks. Follow SOS protocol. Moidalities PRN. Advised to ice at home after PT sessions.    PT Home Exercise Plan Cane assisted external rotation, traps shrug relax, pendulum    Consulted and Agree with Plan of Care Patient             Patient will benefit from skilled therapeutic intervention in order to improve the following deficits and impairments:  Decreased endurance, Decreased range of motion, Decreased safety awareness, Increased fascial restricitons, Pain, Increased muscle spasms, Impaired UE functional use, Decreased strength  Visit Diagnosis: Stiffness of right shoulder, not elsewhere classified  Acute pain of right shoulder  Muscle weakness (generalized)     Problem List Patient Active Problem List   Diagnosis Date Noted   DEPRESSION 12/09/2007   MIGRAINE, CHRONIC 12/09/2007   ESOPHAGITIS 12/09/2007   GERD 12/09/2007   PEPTIC ULCER DISEASE 12/09/2007   DYSPEPSIA 12/09/2007  DIARRHEA 12/09/2007   CHOLELITHIASIS, HX OF 12/09/2007    Dessie Comaavid J Dylana Shaw PT DPT  05/29/2021, 11:33 AM  Ermelinda Dasicardo Santos Morales DPT  05/29/2021   During this treatment session, the therapist was present, participating in and directing the treatment.   [facility] [address] [place], [place], [address] Phone: [phone]   Fax:  [phone]  Name: Caitlin Martinez MRN: [id number] Date of Birth: 13-Jul-1971

## 2021-06-03 ENCOUNTER — Encounter: Payer: Self-pay | Admitting: Orthopedic Surgery

## 2021-06-03 NOTE — Progress Notes (Signed)
   Post-Op Visit Note   Patient: Caitlin Martinez           Date of Birth: 1971/03/09           MRN: [id number] Visit Date: 05/27/2021 PCP: Catha Gosselin, MD   Assessment & Plan:  Chief Complaint:  Chief Complaint  Patient presents with   Right Shoulder - Pain, Routine Post Op   Visit Diagnoses:  1. Tear of right supraspinatus tendon   2. Right shoulder pain, unspecified chronicity     Plan: Caitlin Martinez is a patient who is now 2 weeks out right shoulder rotator cuff repair.  Doing well.  Has some occasional soreness and discomfort.  She is at 120 on CPM machine.  Going to [place] on vacation next.  On exam she is got good deltoid strength.  No coarse grinding or crepitus with passive range of motion.  Like her to start physical therapy for isometric exercises passive range of motion plus deltoid and rotator cuff strength isometrics 3 times a week for 3 weeks then okay for strengthening.  She can do that at [facility].  Come back and see me in 3 weeks for clinical recheck.  Okay to discontinue that sling but no lifting with that left arm until she comes back.  Follow-Up Instructions: Return in about 3 weeks (around 06/17/2021).   Orders:  Orders Placed This Encounter  Procedures   Ambulatory referral to Physical Therapy   No orders of the defined types were placed in this encounter.   Imaging: No results found.  PMFS History: Patient Active Problem List   Diagnosis Date Noted   DEPRESSION 12/09/2007   MIGRAINE, CHRONIC 12/09/2007   ESOPHAGITIS 12/09/2007   GERD 12/09/2007   PEPTIC ULCER DISEASE 12/09/2007   DYSPEPSIA 12/09/2007   DIARRHEA 12/09/2007   CHOLELITHIASIS, HX OF 12/09/2007   Past Medical History:  Diagnosis Date   Migraines     Family History  Problem Relation Age of Onset   Hypertension Mother    Diabetes Father    Diabetes Brother     Past Surgical History:  Procedure Laterality Date   CHOLECYSTECTOMY     PLACEMENT OF  BREAST IMPLANTS     Social History   Occupational History   Not on file  Tobacco Use   Smoking status: Never   Smokeless tobacco: Never  Substance and Sexual Activity   Alcohol use: Yes   Drug use: No   Sexual activity: Not on file

## 2021-06-11 ENCOUNTER — Ambulatory Visit (HOSPITAL_BASED_OUTPATIENT_CLINIC_OR_DEPARTMENT_OTHER): Payer: BC Managed Care – PPO | Admitting: Physical Therapy

## 2021-06-11 ENCOUNTER — Encounter (HOSPITAL_BASED_OUTPATIENT_CLINIC_OR_DEPARTMENT_OTHER): Payer: Self-pay | Admitting: Physical Therapy

## 2021-06-11 ENCOUNTER — Other Ambulatory Visit: Payer: Self-pay

## 2021-06-11 DIAGNOSIS — M25611 Stiffness of right shoulder, not elsewhere classified: Secondary | ICD-10-CM

## 2021-06-11 DIAGNOSIS — M6281 Muscle weakness (generalized): Secondary | ICD-10-CM | POA: Diagnosis not present

## 2021-06-11 DIAGNOSIS — M25511 Pain in right shoulder: Secondary | ICD-10-CM

## 2021-06-12 ENCOUNTER — Encounter (HOSPITAL_BASED_OUTPATIENT_CLINIC_OR_DEPARTMENT_OTHER): Payer: Self-pay | Admitting: Physical Therapy

## 2021-06-12 NOTE — Therapy (Signed)
[facility] [address] [place], [place], [address] Phone: [phone]   Fax:  [phone]  Physical Therapy Treatment  Patient Details  Name: Caitlin Martinez MRN: [id number] Date of Birth: [birth year] Referring Provider (PT): Cammy Copa MD   Encounter Date: 06/11/2021   PT End of Session - 06/12/21 1034     Visit Number 2    Number of Visits 16    Date for PT Re-Evaluation 07/24/21    Authorization Type BCBS 2022 52 visits    PT Start Time 1345    PT Stop Time 1425    PT Time Calculation (min) 40 min             Past Medical History:  Diagnosis Date   Migraines     Past Surgical History:  Procedure Laterality Date   CHOLECYSTECTOMY     PLACEMENT OF BREAST IMPLANTS      There were no vitals filed for this visit.   Subjective Assessment - 06/11/21 1413     Subjective Patient reports that she got sore while on vacation. She was up and doing a lot. It is minorly sore today. Overall she is doing OK. She is still using the sling most of the time.    Pertinent History 3 weeks out of surgery    Limitations Lifting;House hold activities    How long can you sit comfortably? N/A    How long can you stand comfortably? N/A    How long can you walk comfortably? N/A    Diagnostic tests nothing post-op    Patient Stated Goals to return to active lifestyle without restrictions    Currently in Pain? Yes    Pain Score 4     Pain Location Shoulder    Pain Orientation Right    Pain Descriptors / Indicators Aching    Pain Type Chronic pain    Pain Onset 1 to 4 weeks ago    Pain Frequency Intermittent    Aggravating Factors  use of the arm                               [facility] Adult PT Treatment/Exercise - 06/12/21 0001       Shoulder Exercises: Supine   Other Supine Exercises attmepted AAROM with can patien thad limited tolerable range. not given for HEP 5x;    Other Supine  Exercises wan er reviewed technuqe for home. Able to fdo with mod cuing. Patient advised not to force ROM but tolightly move it though available range x10;      Shoulder Exercises: Seated   Other Seated Exercises pulley bent arm felxion x15. Patient will get one ofr home      Shoulder Exercises: Standing   Extension Limitations to neutral yellow band x10; could feel in her bicep een in limited rnage so patient not given for home.    Other Standing Exercises seated scap retraction 3x10      Manual Therapy   Manual Therapy Joint mobilization    Joint Mobilization tried light grade 1 and II mobilizations of the shoulder but patient had pain in the area of the scar.    Soft tissue mobilization to upper trap    Passive ROM into ER /IR and flexion with light distraction                    PT Education - 06/12/21 1034  Education Details reviewed exercises for home; use of the pulleys    Person(s) Educated Patient    Methods Explanation;Demonstration;Tactile cues;Verbal cues    Comprehension Verbalized understanding;Returned demonstration;Verbal cues required;Tactile cues required              PT Short Term Goals - 05/29/21 1037       PT SHORT TERM GOAL #1   Title Patient will increase passive ER to 75 degrees    Time 3    Period Weeks    Status New    Target Date 06/19/21      PT SHORT TERM GOAL #2   Title Patient will incrrease passive shoulder flexion to 120 degrees    Time 3    Period Weeks    Status New    Target Date 06/19/21      PT SHORT TERM GOAL #3   Title Patient will be indepdnent with a base scpaular strengthening and isomtric program    Time 3    Period Weeks    Status New    Target Date 06/19/21               PT Long Term Goals - 05/29/21 1044       PT LONG TERM GOAL #1   Title Patient will reach overhead to a shelf without increased pain    Time 8    Period Weeks    Status New    Target Date 07/24/21      PT LONG TERM GOAL #2    Title Patient will reach behind her head to do her hair without pain    Time 8    Period Weeks    Status New    Target Date 07/24/21      PT LONG TERM GOAL #3   Title Patient will reach behind her back to L4 without pain in order tp perfrom self care tasks    Time 8    Period Weeks    Status New    Target Date 07/24/21                   Plan - 06/12/21 1041     Clinical Impression Statement Patient tolerated treatment well. She did have pain down her bicpes with activity and stretching. She had no significant increase in pain with activity. Her ROM is progressing well. Therapy began light scapula strengthening. She reported minor discomfrot in her biceps area despite being kept in a limited range. Therapy will continue to monitor, She has some tenderness in her scar and appears to have mild hypertrophic scaring. She was advised to use vitamin E and perfrom light soft tissue mobilization to the area at home.    Personal Factors and Comorbidities Comorbidity 1    Comorbidities migranes    Examination-Activity Limitations Bathing;Carry;Bed Mobility;Reach Overhead;Lift    Examination-Participation Restrictions Community Activity;Laundry;Yard Work;Volunteer;Cleaning    Stability/Clinical Decision Making Stable/Uncomplicated    Clinical Decision Making Low    Rehab Potential Excellent    PT Frequency 2x / week    PT Duration 8 weeks    PT Treatment/Interventions ADLs/Self Care Home Management;Aquatic Therapy;Cryotherapy;Moist Heat;Functional mobility training;Therapeutic activities;Therapeutic exercise;Neuromuscular re-education;Patient/family education;Scar mobilization;Passive range of motion;Energy conservation;Dry needling;Splinting;Taping;Vasopneumatic Device;Joint Manipulations    PT Next Visit Plan Continue with improving range of motion in shoulder flexion and shoulder external rotation. Progress to AAROM at 4 weeks. Propress to Isomtrics and scpaular strengthening at 5-6  weeks. Follow SOS protocol. Moidalities PRN. Advised to ice at  home after PT sessions.    PT Home Exercise Plan Cane assisted external rotation, traps shrug relax, pendulum             Patient will benefit from skilled therapeutic intervention in order to improve the following deficits and impairments:  Decreased endurance, Decreased range of motion, Decreased safety awareness, Increased fascial restricitons, Pain, Increased muscle spasms, Impaired UE functional use, Decreased strength  Visit Diagnosis: Stiffness of right shoulder, not elsewhere classified  Acute pain of right shoulder  Muscle weakness (generalized)     Problem List Patient Active Problem List   Diagnosis Date Noted   DEPRESSION 12/09/2007   MIGRAINE, CHRONIC 12/09/2007   ESOPHAGITIS 12/09/2007   GERD 12/09/2007   PEPTIC ULCER DISEASE 12/09/2007   DYSPEPSIA 12/09/2007   DIARRHEA 12/09/2007   CHOLELITHIASIS, HX OF 12/09/2007    [name] PT DPT  06/12/2021, 8:07 PM  [facility] [address] [place], [place], [address] Phone: [phone]   Fax:  [phone]  Name: Caitlin Martinez MRN: [id number] Date of Birth: 12/25/1970

## 2021-06-13 ENCOUNTER — Encounter (HOSPITAL_BASED_OUTPATIENT_CLINIC_OR_DEPARTMENT_OTHER): Payer: Self-pay | Admitting: Physical Therapy

## 2021-06-13 ENCOUNTER — Ambulatory Visit (HOSPITAL_BASED_OUTPATIENT_CLINIC_OR_DEPARTMENT_OTHER): Payer: BC Managed Care – PPO | Admitting: Physical Therapy

## 2021-06-13 ENCOUNTER — Other Ambulatory Visit: Payer: Self-pay

## 2021-06-13 DIAGNOSIS — M25611 Stiffness of right shoulder, not elsewhere classified: Secondary | ICD-10-CM

## 2021-06-13 DIAGNOSIS — M6281 Muscle weakness (generalized): Secondary | ICD-10-CM | POA: Diagnosis not present

## 2021-06-13 DIAGNOSIS — M25511 Pain in right shoulder: Secondary | ICD-10-CM | POA: Diagnosis not present

## 2021-06-14 NOTE — Therapy (Signed)
[facility] [address] [place], [place], [address] Phone: [phone]   Fax:  [phone]  Physical Therapy Treatment  Patient Details  Name: Caitlin Martinez MRN: [id number] Date of Birth: 05/12/1971 Referring Provider (PT): Cammy Copa MD   Encounter Date: 06/13/2021   PT End of Session - 06/14/21 1217     Visit Number 3    Number of Visits 16    Date for PT Re-Evaluation 07/24/21    Authorization Type BCBS 2022 52 visits    PT Start Time 1145    PT Stop Time 1228    PT Time Calculation (min) 43 min    Activity Tolerance Patient tolerated treatment well    Behavior During Therapy [facility] for tasks assessed/performed             Past Medical History:  Diagnosis Date   Migraines     Past Surgical History:  Procedure Laterality Date   CHOLECYSTECTOMY     PLACEMENT OF BREAST IMPLANTS      There were no vitals filed for this visit.   Subjective Assessment - 06/13/21 1153     Subjective Patient felt a little achy yesterday. She wasn't too bad after the treatment. She reports it was not as bad as it was last week.    Pertinent History 3 weeks out of surgery    Limitations Lifting;House hold activities    How long can you sit comfortably? N/A    How long can you stand comfortably? N/A    How long can you walk comfortably? N/A    Diagnostic tests nothing post-op    Patient Stated Goals to return to active lifestyle without restrictions    Currently in Pain? No/denies                [facility] PT Assessment - 06/14/21 0001       Assessment   Medical Diagnosis right RTC repair and Bicpes Tenodesis    Referring Provider (PT) Cammy Copa MD    Onset Date/Surgical Date 05/06/21    Hand Dominance Right    Next MD Visit 3 weeks    Prior Therapy Had therapy prior to the surgery without significant improvement      Precautions   Precautions None      Restrictions   Weight Bearing  Restrictions No      Home Environment   Additional Comments Nothing pertinant      Prior Function   Level of Independence Independent    Vocation Full time employment    Vocation Requirements sells comercial insurance    Leisure running, going to the gym      Cognition   Overall Cognitive Status Within Functional Limits for tasks assessed    Attention Focused    Focused Attention Appears intact    Memory Appears intact    Awareness Appears intact    Problem Solving Appears intact      Observation/Other Assessments   Observations Paer Patient MD will alow her in the pool in the next week or two.    Skin Integrity well healed surgical ports      Sensation   Additional Comments numbenss into the last 2 fingers      Coordination   Gross Motor Movements are Fluid and Coordinated Yes    Fine Motor Movements are Fluid and Coordinated Yes      AROM   Overall AROM Comments not tested 2nd to recent surgery  PROM   Overall PROM Comments PROM past expected range for time frame.    Right Shoulder Flexion 104 Degrees      Strength   Overall Strength Unable to assess    Overall Strength Comments not tested 2nd to recent surgery                           [facility] Adult PT Treatment/Exercise - 06/14/21 0001       Shoulder Exercises: Supine   Other Supine Exercises AAROM FLEXION WITH CANE X10;    Other Supine Exercises wan er reviewed technuqe for home. Able to fdo with mod cuing. Patient advised not to force ROM but tolightly move it though available range x10;      Shoulder Exercises: Seated   Other Seated Exercises pulley bent arm felxion x15. Patient will get one ofr home      Shoulder Exercises: Standing   Extension Limitations to neutral yellow band 2x10; yellow band rows 2x10    Other Standing Exercises seated scap retraction 3x10      Manual Therapy   Manual Therapy Joint mobilization    Joint Mobilization tried light grade 1 and II mobilizations of  the shoulder but patient had pain in the area of the scar.    Soft tissue mobilization to upper trap    Passive ROM into ER /IR and flexion with light distraction                    PT Education - 06/14/21 1216     Education Details reviewed HEP for the weekend    Person(s) Educated Patient    Methods Explanation;Demonstration;Tactile cues;Verbal cues    Comprehension Verbalized understanding;Returned demonstration;Verbal cues required;Tactile cues required              PT Short Term Goals - 05/29/21 1037       PT SHORT TERM GOAL #1   Title Patient will increase passive ER to 75 degrees    Time 3    Period Weeks    Status New    Target Date 06/19/21      PT SHORT TERM GOAL #2   Title Patient will incrrease passive shoulder flexion to 120 degrees    Time 3    Period Weeks    Status New    Target Date 06/19/21      PT SHORT TERM GOAL #3   Title Patient will be indepdnent with a base scpaular strengthening and isomtric program    Time 3    Period Weeks    Status New    Target Date 06/19/21               PT Long Term Goals - 05/29/21 1044       PT LONG TERM GOAL #1   Title Patient will reach overhead to a shelf without increased pain    Time 8    Period Weeks    Status New    Target Date 07/24/21      PT LONG TERM GOAL #2   Title Patient will reach behind her head to do her hair without pain    Time 8    Period Weeks    Status New    Target Date 07/24/21      PT LONG TERM GOAL #3   Title Patient will reach behind her back to L4 without pain in order tp perfrom self care tasks  Time 8    Period Weeks    Status New    Target Date 07/24/21                   Plan - 06/14/21 1217     Clinical Impression Statement Patient is still slightly limited in ER but overall she is progressing well. She had improved ability to perfrom wand AAROM. She was given light exercises to perform at home. She had no pain with any of her HEP.  Therapy will continue to advance per protocol.    Personal Factors and Comorbidities Comorbidity 1    Comorbidities migranes    Examination-Activity Limitations Bathing;Carry;Bed Mobility;Reach Overhead;Lift    Examination-Participation Restrictions Community Activity;Laundry;Yard Work;Volunteer;Cleaning    Stability/Clinical Decision Making Stable/Uncomplicated    Clinical Decision Making Low    Rehab Potential Excellent    PT Frequency 2x / week    PT Duration 8 weeks    PT Treatment/Interventions ADLs/Self Care Home Management;Aquatic Therapy;Cryotherapy;Moist Heat;Functional mobility training;Therapeutic activities;Therapeutic exercise;Neuromuscular re-education;Patient/family education;Scar mobilization;Passive range of motion;Energy conservation;Dry needling;Splinting;Taping;Vasopneumatic Device;Joint Manipulations    PT Next Visit Plan Continue with improving range of motion in shoulder flexion and shoulder external rotation. Progress to AAROM at 4 weeks. Propress to Isomtrics and scpaular strengthening at 5-6 weeks. Follow SOS protocol. Moidalities PRN. Advised to ice at home after PT sessions.    PT Home Exercise Plan Cane assisted external rotation, traps shrug relax, pendulum  [id number]    Consulted and Agree with Plan of Care Patient             Patient will benefit from skilled therapeutic intervention in order to improve the following deficits and impairments:  Decreased endurance, Decreased range of motion, Decreased safety awareness, Increased fascial restricitons, Pain, Increased muscle spasms, Impaired UE functional use, Decreased strength  Visit Diagnosis: Stiffness of right shoulder, not elsewhere classified  Acute pain of right shoulder  Muscle weakness (generalized)     Problem List Patient Active Problem List   Diagnosis Date Noted   DEPRESSION 12/09/2007   MIGRAINE, CHRONIC 12/09/2007   ESOPHAGITIS 12/09/2007   GERD 12/09/2007   PEPTIC ULCER DISEASE  12/09/2007   DYSPEPSIA 12/09/2007   DIARRHEA 12/09/2007   CHOLELITHIASIS, HX OF 12/09/2007    [name] PT DPT  06/14/2021, 12:31 PM  [facility] [address] [place], [place], [address] Phone: [phone]   Fax:  [phone]  Name: Caitlin Martinez MRN: [id number] Date of Birth: 05-Oct-1971

## 2021-06-18 ENCOUNTER — Ambulatory Visit (HOSPITAL_BASED_OUTPATIENT_CLINIC_OR_DEPARTMENT_OTHER): Payer: BC Managed Care – PPO | Admitting: Physical Therapy

## 2021-06-18 ENCOUNTER — Other Ambulatory Visit: Payer: Self-pay

## 2021-06-18 DIAGNOSIS — M25611 Stiffness of right shoulder, not elsewhere classified: Secondary | ICD-10-CM

## 2021-06-18 DIAGNOSIS — M25511 Pain in right shoulder: Secondary | ICD-10-CM

## 2021-06-18 DIAGNOSIS — M6281 Muscle weakness (generalized): Secondary | ICD-10-CM

## 2021-06-18 NOTE — Therapy (Signed)
[facility] [address] [place], [place], [address] Phone: [phone]   Fax:  [phone]  Physical Therapy Treatment  Patient Details  Name: ALEEZA BELLVILLE MRN: [id number] Date of Birth: October 25, 1970 Referring Provider (PT): Cammy Copa MD   Encounter Date: 06/18/2021   PT End of Session - 06/18/21 1431     Visit Number 4    Number of Visits 16    Date for PT Re-Evaluation 07/24/21    Authorization Type BCBS 2022 52 visits    PT Start Time 1345    PT Stop Time 1425    PT Time Calculation (min) 40 min    Activity Tolerance Patient tolerated treatment well    Behavior During Therapy [facility] for tasks assessed/performed             Past Medical History:  Diagnosis Date   Migraines     Past Surgical History:  Procedure Laterality Date   CHOLECYSTECTOMY     PLACEMENT OF BREAST IMPLANTS      There were no vitals filed for this visit.   Subjective Assessment - 06/18/21 1430     Subjective Patient has elt pretty good. She has a little stightness ast times. she has not been wearing the sling. She has no pain today.    Pertinent History 3 weeks out of surgery    Limitations Lifting;House hold activities    How long can you sit comfortably? N/A    How long can you stand comfortably? N/A    How long can you walk comfortably? N/A    Diagnostic tests nothing post-op    Currently in Pain? No/denies                [facility] PT Assessment - 06/18/21 0001       PROM   Right Shoulder Flexion 150 Degrees    Right Shoulder Internal Rotation 70 Degrees    Right Shoulder External Rotation 65 Degrees                           [facility] Adult PT Treatment/Exercise - 06/18/21 0001       Shoulder Exercises: Supine   Other Supine Exercises AAROM FLEXION WITH CANE X10;    Other Supine Exercises wan er reviewed technuqe for home. Able to fdo with mod cuing. Patient advised not to force ROM  but tolightly move it though available range x10;      Shoulder Exercises: Seated   Other Seated Exercises pulley bent arm felxion x15. Patient will get one ofr home      Shoulder Exercises: Standing   Extension Limitations to neutral yellow band 3x10; yellow band rows 3x10    Other Standing Exercises isometric er 2x10 isometric IR 2x10;      Manual Therapy   Manual Therapy Joint mobilization    Joint Mobilization tried light grade 1 and II mobilizations of the shoulder but patient had pain in the area of the scar.    Soft tissue mobilization to upper trap    Passive ROM into ER /IR and flexion with light distraction                    PT Education - 06/18/21 1431     Education Details updated HEP for isoetrics    Person(s) Educated Patient    Methods Explanation;Demonstration;Tactile cues;Verbal cues    Comprehension Verbalized understanding;Returned demonstration;Verbal cues required;Tactile cues required  PT Short Term Goals - 05/29/21 1037       PT SHORT TERM GOAL #1   Title Patient will increase passive ER to 75 degrees    Time 3    Period Weeks    Status New    Target Date 06/19/21      PT SHORT TERM GOAL #2   Title Patient will incrrease passive shoulder flexion to 120 degrees    Time 3    Period Weeks    Status New    Target Date 06/19/21      PT SHORT TERM GOAL #3   Title Patient will be indepdnent with a base scpaular strengthening and isomtric program    Time 3    Period Weeks    Status New    Target Date 06/19/21               PT Long Term Goals - 05/29/21 1044       PT LONG TERM GOAL #1   Title Patient will reach overhead to a shelf without increased pain    Time 8    Period Weeks    Status New    Target Date 07/24/21      PT LONG TERM GOAL #2   Title Patient will reach behind her head to do her hair without pain    Time 8    Period Weeks    Status New    Target Date 07/24/21      PT LONG TERM GOAL #3    Title Patient will reach behind her back to L4 without pain in order tp perfrom self care tasks    Time 8    Period Weeks    Status New    Target Date 07/24/21                   Plan - 06/18/21 1417     Clinical Impression Statement Patient is progressing well. She had no significant pain with treatment. Therapy advanced her reps with scap strengthening. her range is progressing well. She was shown isometrics today. She will be progressed to active strengthening as tolerated. She will see the MD on 8/31/022.    Personal Factors and Comorbidities Comorbidity 1    Comorbidities migranes    Examination-Activity Limitations Bathing;Carry;Bed Mobility;Reach Overhead;Lift    Examination-Participation Restrictions Community Activity;Laundry;Yard Work;Volunteer;Cleaning    Stability/Clinical Decision Making Stable/Uncomplicated    Clinical Decision Making Low    Rehab Potential Excellent    PT Frequency 2x / week    PT Duration 8 weeks    PT Treatment/Interventions ADLs/Self Care Home Management;Aquatic Therapy;Cryotherapy;Moist Heat;Functional mobility training;Therapeutic activities;Therapeutic exercise;Neuromuscular re-education;Patient/family education;Scar mobilization;Passive range of motion;Energy conservation;Dry needling;Splinting;Taping;Vasopneumatic Device;Joint Manipulations    PT Next Visit Plan Continue with improving range of motion in shoulder flexion and shoulder external rotation. Progress to AAROM at 4 weeks. Propress to Isomtrics and scpaular strengthening at 5-6 weeks. Follow SOS protocol. Moidalities PRN. Advised to ice at home after PT sessions.    PT Home Exercise Plan Cane assisted external rotation, traps shrug relax, pendulum  [id number]    Consulted and Agree with Plan of Care Patient             Patient will benefit from skilled therapeutic intervention in order to improve the following deficits and impairments:  Decreased endurance, Decreased range of  motion, Decreased safety awareness, Increased fascial restricitons, Pain, Increased muscle spasms, Impaired UE functional use, Decreased strength  Visit Diagnosis: Stiffness of right  shoulder, not elsewhere classified  Acute pain of right shoulder  Muscle weakness (generalized)     Problem List Patient Active Problem List   Diagnosis Date Noted   DEPRESSION 12/09/2007   MIGRAINE, CHRONIC 12/09/2007   ESOPHAGITIS 12/09/2007   GERD 12/09/2007   PEPTIC ULCER DISEASE 12/09/2007   DYSPEPSIA 12/09/2007   DIARRHEA 12/09/2007   CHOLELITHIASIS, HX OF 12/09/2007    [name] PT DPT  06/18/2021, 2:48 PM  [facility] [address] [place], [place], [address] Phone: [phone]   Fax:  [phone]  Name: AMPARO DONALSON MRN: [id number] Date of Birth: 05-16-1971

## 2021-06-19 ENCOUNTER — Ambulatory Visit (INDEPENDENT_AMBULATORY_CARE_PROVIDER_SITE_OTHER): Payer: BC Managed Care – PPO | Admitting: Orthopedic Surgery

## 2021-06-19 ENCOUNTER — Encounter: Payer: Self-pay | Admitting: Orthopedic Surgery

## 2021-06-19 DIAGNOSIS — M75101 Unspecified rotator cuff tear or rupture of right shoulder, not specified as traumatic: Secondary | ICD-10-CM

## 2021-06-19 NOTE — Progress Notes (Signed)
   Post-Op Visit Note   Patient: Caitlin Martinez           Date of Birth: 1971/05/06           MRN: [id number] Visit Date: 06/19/2021 PCP: Catha Gosselin, MD   Assessment & Plan:  Chief Complaint:  Chief Complaint  Patient presents with   Other    05/06/21 right shoulder scope, biceps tenodesis, deb   Visit Diagnoses:  1. Tear of right supraspinatus tendon     Plan: Patient is a 50 year old female who presents s/p right shoulder arthroscopy with biceps tenodesis and rotator cuff repair on 05/06/2021.  She is doing well overall.  Only has to take occasional ibuprofen for pain control.  No taking opioid medication.  She notes occasional popping and clicking and some occasional "nerve pain" in her hand but nothing consistent.  No consistent numbness and tingling.  She is in physical therapy 2 times per week and feels this is going well.  She is progressing with her range of motion.  They are starting to work on strengthening exercises for her.  On exam she has 40 degrees external rotation, 80 degrees abduction, 130 degrees forward flexion.  Incision healing well without any evidence of infection or dehiscence.  No significant crepitus noted with passive motion of the shoulder.  She has excellent rotator cuff strength rated 5/5 of supra, infra, subscap.  No Popeye deformity noted.  Plan is for patient to follow-up in 6 weeks for final check with Dr. August Saucer.  She understands that full recovery from the surgery will take several more months but she is progressing well at this time.  She will stop by the office to drop off paperwork for clearing her to return to work.  She would like to work remotely for the next several months instead of going in to her job where she has to do some lifting which she feels she is not ready for at this time.  Follow-Up Instructions: No follow-ups on file.   Orders:  No orders of the defined types were placed in this encounter.  No orders of the defined  types were placed in this encounter.   Imaging: No results found.  PMFS History: Patient Active Problem List   Diagnosis Date Noted   DEPRESSION 12/09/2007   MIGRAINE, CHRONIC 12/09/2007   ESOPHAGITIS 12/09/2007   GERD 12/09/2007   PEPTIC ULCER DISEASE 12/09/2007   DYSPEPSIA 12/09/2007   DIARRHEA 12/09/2007   CHOLELITHIASIS, HX OF 12/09/2007   Past Medical History:  Diagnosis Date   Migraines     Family History  Problem Relation Age of Onset   Hypertension Mother    Diabetes Father    Diabetes Brother     Past Surgical History:  Procedure Laterality Date   CHOLECYSTECTOMY     PLACEMENT OF BREAST IMPLANTS     Social History   Occupational History   Not on file  Tobacco Use   Smoking status: Never   Smokeless tobacco: Never  Substance and Sexual Activity   Alcohol use: Yes   Drug use: No   Sexual activity: Not on file

## 2021-06-20 ENCOUNTER — Encounter (HOSPITAL_BASED_OUTPATIENT_CLINIC_OR_DEPARTMENT_OTHER): Payer: Self-pay | Admitting: Physical Therapy

## 2021-06-20 ENCOUNTER — Ambulatory Visit (HOSPITAL_BASED_OUTPATIENT_CLINIC_OR_DEPARTMENT_OTHER): Payer: BC Managed Care – PPO | Attending: Orthopedic Surgery | Admitting: Physical Therapy

## 2021-06-20 ENCOUNTER — Other Ambulatory Visit: Payer: Self-pay

## 2021-06-20 DIAGNOSIS — M6281 Muscle weakness (generalized): Secondary | ICD-10-CM | POA: Insufficient documentation

## 2021-06-20 DIAGNOSIS — M25511 Pain in right shoulder: Secondary | ICD-10-CM | POA: Insufficient documentation

## 2021-06-20 DIAGNOSIS — M25611 Stiffness of right shoulder, not elsewhere classified: Secondary | ICD-10-CM | POA: Diagnosis not present

## 2021-06-20 NOTE — Therapy (Signed)
[facility] [address] [place], [place], [address] Phone: [phone]   Fax:  [phone]  Physical Therapy Treatment  Patient Details  Name: Caitlin Martinez MRN: [id number] Date of Birth: 08-01-1971 Referring Provider (PT): Cammy Copa MD   Encounter Date: 06/20/2021   PT End of Session - 06/20/21 0825     Visit Number 5    Number of Visits 16    Date for PT Re-Evaluation 07/24/21    Authorization Type BCBS 2022 52 visits    PT Start Time 0800    PT Stop Time 0843    PT Time Calculation (min) 43 min    Activity Tolerance Patient tolerated treatment well    Behavior During Therapy [facility] for tasks assessed/performed             Past Medical History:  Diagnosis Date   Migraines     Past Surgical History:  Procedure Laterality Date   CHOLECYSTECTOMY     PLACEMENT OF BREAST IMPLANTS      There were no vitals filed for this visit.   Subjective Assessment - 06/20/21 0808     Subjective Patient went back to MD who removed all restrictions. Patient still advised to follow proegression of strengthening. She had increased soreness 2nd to doing a little too much yesterday folling her appointment.    Pertinent History 3 weeks out of surgery    Limitations Lifting;House hold activities    How long can you sit comfortably? N/A    How long can you stand comfortably? N/A    How long can you walk comfortably? N/A    Diagnostic tests nothing post-op    Patient Stated Goals to return to active lifestyle without restrictions    Currently in Pain? Yes    Pain Score 3     Pain Location Shoulder    Pain Orientation Right    Pain Descriptors / Indicators Aching    Pain Type Chronic pain    Pain Onset 1 to 4 weeks ago    Pain Frequency Intermittent    Aggravating Factors  use of the arm    Pain Relieving Factors medication    Effect of Pain on Daily Activities difficulty perfroming ADL's    Multiple Pain  Sites No                               [facility] Adult PT Treatment/Exercise - 06/20/21 0001       Shoulder Exercises: Supine   Other Supine Exercises AAROM FLEXION WITH CANE X10;    Other Supine Exercises wan er reviewed technuqe for home. Able to fdo with mod cuing. Patient advised not to force ROM but tolightly move it though available range x10;      Shoulder Exercises: Seated   Other Seated Exercises pulley bent arm felxion x15. Patient will get one ofr home      Shoulder Exercises: Standing   Theraband Level (Shoulder Internal Rotation) Level 1 (Yellow)    Internal Rotation Limitations 3x10    Extension Limitations to neutral yellow band 3x10;    Row Limitations 3x10 red    Other Standing Exercises finger ladder 3x and 2x      Manual Therapy   Manual Therapy Joint mobilization    Joint Mobilization tried light grade 1 and II mobilizations of the shoulder but patient had pain in the area of the scar.    Soft  tissue mobilization to upper trap    Passive ROM into ER /IR and flexion with light distraction                    PT Education - 06/20/21 0824     Education Details reviewed HEP    Person(s) Educated Patient    Methods Explanation;Demonstration;Verbal cues;Tactile cues    Comprehension Verbalized understanding;Returned demonstration;Verbal cues required;Tactile cues required              PT Short Term Goals - 05/29/21 1037       PT SHORT TERM GOAL #1   Title Patient will increase passive ER to 75 degrees    Time 3    Period Weeks    Status New    Target Date 06/19/21      PT SHORT TERM GOAL #2   Title Patient will incrrease passive shoulder flexion to 120 degrees    Time 3    Period Weeks    Status New    Target Date 06/19/21      PT SHORT TERM GOAL #3   Title Patient will be indepdnent with a base scpaular strengthening and isomtric program    Time 3    Period Weeks    Status New    Target Date 06/19/21                PT Long Term Goals - 05/29/21 1044       PT LONG TERM GOAL #1   Title Patient will reach overhead to a shelf without increased pain    Time 8    Period Weeks    Status New    Target Date 07/24/21      PT LONG TERM GOAL #2   Title Patient will reach behind her head to do her hair without pain    Time 8    Period Weeks    Status New    Target Date 07/24/21      PT LONG TERM GOAL #3   Title Patient will reach behind her back to L4 without pain in order tp perfrom self care tasks    Time 8    Period Weeks    Status New    Target Date 07/24/21                   Plan - 06/20/21 0841     Clinical Impression Statement Therapy added active ER to neutral and light resisted IR to her plan. She had no increase in pain. Therapy also advanced her scpaular exxercises to a red band. Overall she is making great progress. She reported MD had cleared her to begin reaching overhead but she was encouraged to follow the progression she is on with rehab. She became sore after doing too much after the MD visit yesterday. She was advised to ice at home. Therapy will continue to progress as tolerated.    Personal Factors and Comorbidities Comorbidity 1    Comorbidities migranes    Examination-Activity Limitations Bathing;Carry;Bed Mobility;Reach Overhead;Lift    Examination-Participation Restrictions Community Activity;Laundry;Yard Work;Volunteer;Cleaning    Stability/Clinical Decision Making Stable/Uncomplicated    Clinical Decision Making Low    Rehab Potential Excellent    PT Frequency 2x / week    PT Duration 8 weeks    PT Treatment/Interventions ADLs/Self Care Home Management;Aquatic Therapy;Cryotherapy;Moist Heat;Functional mobility training;Therapeutic activities;Therapeutic exercise;Neuromuscular re-education;Patient/family education;Scar mobilization;Passive range of motion;Energy conservation;Dry needling;Splinting;Taping;Vasopneumatic Device;Joint Manipulations    PT Next  Visit  Plan Continue with improving range of motion in shoulder flexion and shoulder external rotation. Progress to AAROM at 4 weeks. Propress to Isomtrics and scpaular strengthening at 5-6 weeks. Follow SOS protocol. Moidalities PRN. Advised to ice at home after PT sessions.    PT Home Exercise Plan Cane assisted external rotation, traps shrug relax, pendulum  [id number] see updated HEP    Consulted and Agree with Plan of Care Patient             Patient will benefit from skilled therapeutic intervention in order to improve the following deficits and impairments:  Decreased endurance, Decreased range of motion, Decreased safety awareness, Increased fascial restricitons, Pain, Increased muscle spasms, Impaired UE functional use, Decreased strength  Visit Diagnosis: Stiffness of right shoulder, not elsewhere classified  Acute pain of right shoulder  Muscle weakness (generalized)     Problem List Patient Active Problem List   Diagnosis Date Noted   DEPRESSION 12/09/2007   MIGRAINE, CHRONIC 12/09/2007   ESOPHAGITIS 12/09/2007   GERD 12/09/2007   PEPTIC ULCER DISEASE 12/09/2007   DYSPEPSIA 12/09/2007   DIARRHEA 12/09/2007   CHOLELITHIASIS, HX OF 12/09/2007    [name] PT DPT  06/20/2021, 2:05 PM  [facility] [address] [place], [place], [address] Phone: [phone]   Fax:  [phone]  Name: Caitlin Martinez MRN: [id number] Date of Birth: [birth year]

## 2021-06-25 ENCOUNTER — Encounter (HOSPITAL_BASED_OUTPATIENT_CLINIC_OR_DEPARTMENT_OTHER): Payer: Self-pay | Admitting: Physical Therapy

## 2021-06-25 ENCOUNTER — Other Ambulatory Visit: Payer: Self-pay

## 2021-06-25 ENCOUNTER — Ambulatory Visit (HOSPITAL_BASED_OUTPATIENT_CLINIC_OR_DEPARTMENT_OTHER): Payer: BC Managed Care – PPO | Admitting: Physical Therapy

## 2021-06-25 DIAGNOSIS — M25511 Pain in right shoulder: Secondary | ICD-10-CM | POA: Diagnosis not present

## 2021-06-25 DIAGNOSIS — M25611 Stiffness of right shoulder, not elsewhere classified: Secondary | ICD-10-CM | POA: Diagnosis not present

## 2021-06-25 DIAGNOSIS — M6281 Muscle weakness (generalized): Secondary | ICD-10-CM | POA: Diagnosis not present

## 2021-06-26 ENCOUNTER — Encounter (HOSPITAL_BASED_OUTPATIENT_CLINIC_OR_DEPARTMENT_OTHER): Payer: Self-pay | Admitting: Physical Therapy

## 2021-06-26 NOTE — Therapy (Signed)
[facility] [address] [place], [place], [address] Phone: [phone]   Fax:  [phone]  Physical Therapy Treatment  Patient Details  Name: Caitlin Martinez MRN: [id number] Date of Birth: [birth year] Referring Provider (PT): Cammy Copa MD   Encounter Date: 06/25/2021   PT End of Session - 06/25/21 1404     Visit Number 6    Number of Visits 16    Date for PT Re-Evaluation 07/24/21    Authorization Type BCBS 2022 52 visits    PT Start Time 1345    PT Stop Time 1441    PT Time Calculation (min) 56 min    Activity Tolerance Patient tolerated treatment well    Behavior During Therapy [facility] for tasks assessed/performed             Past Medical History:  Diagnosis Date   Migraines     Past Surgical History:  Procedure Laterality Date   CHOLECYSTECTOMY     PLACEMENT OF BREAST IMPLANTS      There were no vitals filed for this visit.   Subjective Assessment - 06/25/21 1400     Subjective Patient tried to cut  a branch with clippers. She is now having pain in the shoulder and she feels like she has fluid in the shoulder. She reports it has been improving over the past few days.    Pertinent History 3 weeks out of surgery    Limitations Lifting;House hold activities    How long can you sit comfortably? N/A    How long can you stand comfortably? N/A    How long can you walk comfortably? N/A    Diagnostic tests nothing post-op    Patient Stated Goals to return to active lifestyle without restrictions    Currently in Pain? Yes    Pain Score 6     Pain Location Shoulder    Pain Orientation Right    Pain Descriptors / Indicators Aching    Pain Type Chronic pain    Pain Onset 1 to 4 weeks ago    Pain Frequency Intermittent    Aggravating Factors  use of the arm    Pain Relieving Factors medication    Effect of Pain on Daily Activities difficulty perfroming ADL's    Multiple Pain Sites No                                [facility] Adult PT Treatment/Exercise - 06/26/21 0001       Shoulder Exercises: Supine   Other Supine Exercises AAROM FLEXION WITH CANE 3X10;    Other Supine Exercises wan er 2x10 ;      Shoulder Exercises: Seated   Other Seated Exercises pulley bent arm felxion x15. Patient will get one ofr home      Shoulder Exercises: Standing   Theraband Level (Shoulder Internal Rotation) Level 1 (Yellow)    Internal Rotation Limitations held today    Extension Limitations to neutral yellow band 3x10;    Row Limitations 310 yellow    Other Standing Exercises finger ladder 3x and 2x      Shoulder Exercises: Pulleys   Flexion Limitations 2 min    Scaption Limitations 2 min      Manual Therapy   Manual Therapy Joint mobilization    Joint Mobilization tried light grade 1 and II mobilizations of the shoulder but patient had pain in the area of  the scar.    Soft tissue mobilization to upper trap    Passive ROM into ER /IR and flexion with light distraction                  Upper Extremity Functional Index Score :   /80   PT Education - 06/25/21 1404     Education Details reviews symptom management including icing    Person(s) Educated Patient    Methods Explanation;Demonstration;Tactile cues;Verbal cues    Comprehension Verbalized understanding;Returned demonstration;Verbal cues required              PT Short Term Goals - 05/29/21 1037       PT SHORT TERM GOAL #1   Title Patient will increase passive ER to 75 degrees    Time 3    Period Weeks    Status New    Target Date 06/19/21      PT SHORT TERM GOAL #2   Title Patient will incrrease passive shoulder flexion to 120 degrees    Time 3    Period Weeks    Status New    Target Date 06/19/21      PT SHORT TERM GOAL #3   Title Patient will be indepdnent with a base scpaular strengthening and isomtric program    Time 3    Period Weeks    Status New    Target Date 06/19/21                PT Long Term Goals - 05/29/21 1044       PT LONG TERM GOAL #1   Title Patient will reach overhead to a shelf without increased pain    Time 8    Period Weeks    Status New    Target Date 07/24/21      PT LONG TERM GOAL #2   Title Patient will reach behind her head to do her hair without pain    Time 8    Period Weeks    Status New    Target Date 07/24/21      PT LONG TERM GOAL #3   Title Patient will reach behind her back to L4 without pain in order tp perfrom self care tasks    Time 8    Period Weeks    Status New    Target Date 07/24/21                   Plan - 06/25/21 1435     Clinical Impression Statement Therapy assessed the patients motion and palpated the bipes and RTC area. The patient had no significant tenderness to palpation. She did have monor crepitus with end range ER. She was advised to continue icing and resting her shoulder ver the next few days. If it does not continue to improve we will send her back to the MD. It has improved though sice the intial incedent. Therapy had the patient perfrom light exercises today. She continues to have full range despite inflamation and pain. Therapy used vasonematic device at the end to reduce inflammation.    Personal Factors and Comorbidities Comorbidity 1    Comorbidities migranes    Examination-Activity Limitations Bathing;Carry;Bed Mobility;Reach Overhead;Lift    Examination-Participation Restrictions Community Activity;Laundry;Yard Work;Volunteer;Cleaning    Stability/Clinical Decision Making Stable/Uncomplicated    Clinical Decision Making Low    Rehab Potential Excellent    PT Frequency 2x / week    PT Duration 8 weeks    PT Treatment/Interventions  ADLs/Self Care Home Management;Aquatic Therapy;Cryotherapy;Moist Heat;Functional mobility training;Therapeutic activities;Therapeutic exercise;Neuromuscular re-education;Patient/family education;Scar mobilization;Passive range of motion;Energy  conservation;Dry needling;Splinting;Taping;Vasopneumatic Device;Joint Manipulations    PT Next Visit Plan Continue with improving range of motion in shoulder flexion and shoulder external rotation. Progress to AAROM at 4 weeks. Propress to Isomtrics and scpaular strengthening at 5-6 weeks. Follow SOS protocol. Moidalities PRN. Advised to ice at home after PT sessions.    PT Home Exercise Plan Cane assisted external rotation, traps shrug relax, pendulum  [id number] see updated HEP             Patient will benefit from skilled therapeutic intervention in order to improve the following deficits and impairments:  Decreased endurance, Decreased range of motion, Decreased safety awareness, Increased fascial restricitons, Pain, Increased muscle spasms, Impaired UE functional use, Decreased strength  Visit Diagnosis: Stiffness of right shoulder, not elsewhere classified  Acute pain of right shoulder  Muscle weakness (generalized)     Problem List Patient Active Problem List   Diagnosis Date Noted   DEPRESSION 12/09/2007   MIGRAINE, CHRONIC 12/09/2007   ESOPHAGITIS 12/09/2007   GERD 12/09/2007   PEPTIC ULCER DISEASE 12/09/2007   DYSPEPSIA 12/09/2007   DIARRHEA 12/09/2007   CHOLELITHIASIS, HX OF 12/09/2007    [name], PT 06/26/2021, 12:22 PM  [facility] [address] [place], [place], [address] Phone: [phone]   Fax:  [phone]  Name: Caitlin Martinez MRN: [id number] Date of Birth: 03/25/1971

## 2021-06-27 ENCOUNTER — Ambulatory Visit (HOSPITAL_BASED_OUTPATIENT_CLINIC_OR_DEPARTMENT_OTHER): Payer: BC Managed Care – PPO | Admitting: Physical Therapy

## 2021-06-27 ENCOUNTER — Telehealth: Payer: Self-pay | Admitting: Orthopedic Surgery

## 2021-06-27 ENCOUNTER — Other Ambulatory Visit: Payer: Self-pay

## 2021-06-27 DIAGNOSIS — M6281 Muscle weakness (generalized): Secondary | ICD-10-CM | POA: Diagnosis not present

## 2021-06-27 DIAGNOSIS — M25611 Stiffness of right shoulder, not elsewhere classified: Secondary | ICD-10-CM

## 2021-06-27 DIAGNOSIS — M25511 Pain in right shoulder: Secondary | ICD-10-CM | POA: Diagnosis not present

## 2021-06-27 NOTE — Telephone Encounter (Signed)
Received medical records release form. $25.00 check and return to work certification note from patient     Forwarding to [place] today

## 2021-06-28 ENCOUNTER — Encounter (HOSPITAL_BASED_OUTPATIENT_CLINIC_OR_DEPARTMENT_OTHER): Payer: Self-pay | Admitting: Physical Therapy

## 2021-06-28 NOTE — Therapy (Signed)
[facility] [address] [place], [place], [address] Phone: [phone]   Fax:  [phone]  Physical Therapy Treatment  Patient Details  Name: Caitlin Martinez MRN: [id number] Date of Birth: 04-25-1971 Referring Provider (PT): Cammy Copa MD   Encounter Date: 06/27/2021   PT End of Session - 06/28/21 1240     Visit Number 7    Number of Visits 16    Date for PT Re-Evaluation 07/24/21    Authorization Type BCBS 2022 52 visits    PT Start Time 0845    PT Stop Time 0943    PT Time Calculation (min) 58 min    Activity Tolerance Patient tolerated treatment well    Behavior During Therapy [facility] for tasks assessed/performed             Past Medical History:  Diagnosis Date   Migraines     Past Surgical History:  Procedure Laterality Date   CHOLECYSTECTOMY     PLACEMENT OF BREAST IMPLANTS      There were no vitals filed for this visit.   Subjective Assessment - 06/28/21 1237     Subjective Patient reports her pain is better but it is still sore. She reports , it dosen't feel right. She feels she is not where she was before her incedent. Therapy advised her that is normal if she got it exacerbated. She was advised if it is no better over the weekend, then return to the MD.    Pertinent History 3 weeks out of surgery    Limitations Lifting;House hold activities    How long can you sit comfortably? N/A    How long can you stand comfortably? N/A    Diagnostic tests nothing post-op    Patient Stated Goals to return to active lifestyle without restrictions    Currently in Pain? Yes    Pain Score 5     Pain Location Shoulder    Pain Orientation Right    Pain Descriptors / Indicators Aching    Pain Type Chronic pain    Pain Onset 1 to 4 weeks ago    Pain Frequency Intermittent    Aggravating Factors  use of the arm    Pain Relieving Factors medication    Effect of Pain on Daily Activities difficulty  pefroming ADL's    Multiple Pain Sites No                               [facility] Adult PT Treatment/Exercise - 06/28/21 0001       Shoulder Exercises: Supine   Other Supine Exercises AAROM FLEXION WITH CANE X10;    Other Supine Exercises wand er 2 x10;      Shoulder Exercises: Sidelying   Other Sidelying Exercises SLER to neutral 2x10      Shoulder Exercises: Standing   Extension Limitations to neutral yellow band 3x10;    Row Limitations 3x10 red    Other Standing Exercises finger ladder 3x and 2x      Shoulder Exercises: Pulleys   Flexion Limitations 2 min    Scaption Limitations 2 min      Manual Therapy   Manual Therapy Joint mobilization    Joint Mobilization light grade 1 and II mobilizations of the shoulder but patient had pain in the area of the scar.    Soft tissue mobilization to upper trap    Passive ROM into ER /IR  and flexion with light distraction                     PT Education - 06/28/21 1239     Education Details reviewed HEP and symptom management    Person(s) Educated Patient    Methods Explanation    Comprehension Verbalized understanding;Returned demonstration;Verbal cues required;Tactile cues required              PT Short Term Goals - 05/29/21 1037       PT SHORT TERM GOAL #1   Title Patient will increase passive ER to 75 degrees    Time 3    Period Weeks    Status New    Target Date 06/19/21      PT SHORT TERM GOAL #2   Title Patient will incrrease passive shoulder flexion to 120 degrees    Time 3    Period Weeks    Status New    Target Date 06/19/21      PT SHORT TERM GOAL #3   Title Patient will be indepdnent with a base scpaular strengthening and isomtric program    Time 3    Period Weeks    Status New    Target Date 06/19/21               PT Long Term Goals - 05/29/21 1044       PT LONG TERM GOAL #1   Title Patient will reach overhead to a shelf without increased pain    Time 8     Period Weeks    Status New    Target Date 07/24/21      PT LONG TERM GOAL #2   Title Patient will reach behind her head to do her hair without pain    Time 8    Period Weeks    Status New    Target Date 07/24/21      PT LONG TERM GOAL #3   Title Patient will reach behind her back to L4 without pain in order tp perfrom self care tasks    Time 8    Period Weeks    Status New    Target Date 07/24/21                   Plan - 06/28/21 1240     Clinical Impression Statement Patients range was back to full today with much less crepitus in the shoulder. Therapy advanced her back to all exeercises. She had mild discomfort but nothing significant. She was advised to continue with current HEP. She was advised to conttact MD if it dosen't improve over the weekend, but she hasmad significant improvement since the last visit.    Personal Factors and Comorbidities Comorbidity 1    Comorbidities migranes    Examination-Activity Limitations Bathing;Carry;Bed Mobility;Reach Overhead;Lift    Examination-Participation Restrictions Community Activity;Laundry;Yard Work;Volunteer;Cleaning    Clinical Decision Making Low    Rehab Potential Excellent    PT Frequency 2x / week    PT Duration 8 weeks    PT Treatment/Interventions ADLs/Self Care Home Management;Aquatic Therapy;Cryotherapy;Moist Heat;Functional mobility training;Therapeutic activities;Therapeutic exercise;Neuromuscular re-education;Patient/family education;Scar mobilization;Passive range of motion;Energy conservation;Dry needling;Splinting;Taping;Vasopneumatic Device;Joint Manipulations    PT Next Visit Plan Continue with improving range of motion in shoulder flexion and shoulder external rotation. Progress to AAROM at 4 weeks. Propress to Isomtrics and scpaular strengthening at 5-6 weeks. Follow SOS protocol. Moidalities PRN. Advised to ice at home after PT sessions.  PT Home Exercise Plan Cane assisted external rotation, traps  shrug relax, pendulum  [id number] see updated HEP    Consulted and Agree with Plan of Care Patient             Patient will benefit from skilled therapeutic intervention in order to improve the following deficits and impairments:  Decreased endurance, Decreased range of motion, Decreased safety awareness, Increased fascial restricitons, Pain, Increased muscle spasms, Impaired UE functional use, Decreased strength  Visit Diagnosis: Stiffness of right shoulder, not elsewhere classified  Acute pain of right shoulder  Muscle weakness (generalized)     Problem List Patient Active Problem List   Diagnosis Date Noted   DEPRESSION 12/09/2007   MIGRAINE, CHRONIC 12/09/2007   ESOPHAGITIS 12/09/2007   GERD 12/09/2007   PEPTIC ULCER DISEASE 12/09/2007   DYSPEPSIA 12/09/2007   DIARRHEA 12/09/2007   CHOLELITHIASIS, HX OF 12/09/2007    [name], PT DPT  06/28/2021, 12:46 PM  [facility] [address] [place], [place], [address] Phone: [phone]   Fax:  [phone]  Name: LORRAYNE ISMAEL MRN: [id number] Date of Birth: 03-19-1971

## 2021-07-01 ENCOUNTER — Other Ambulatory Visit: Payer: Self-pay

## 2021-07-01 ENCOUNTER — Encounter (HOSPITAL_BASED_OUTPATIENT_CLINIC_OR_DEPARTMENT_OTHER): Payer: Self-pay | Admitting: Physical Therapy

## 2021-07-01 ENCOUNTER — Ambulatory Visit (HOSPITAL_BASED_OUTPATIENT_CLINIC_OR_DEPARTMENT_OTHER): Payer: BC Managed Care – PPO | Admitting: Physical Therapy

## 2021-07-01 DIAGNOSIS — M25511 Pain in right shoulder: Secondary | ICD-10-CM | POA: Diagnosis not present

## 2021-07-01 DIAGNOSIS — M25611 Stiffness of right shoulder, not elsewhere classified: Secondary | ICD-10-CM

## 2021-07-01 DIAGNOSIS — M6281 Muscle weakness (generalized): Secondary | ICD-10-CM

## 2021-07-02 ENCOUNTER — Encounter (HOSPITAL_BASED_OUTPATIENT_CLINIC_OR_DEPARTMENT_OTHER): Payer: Self-pay | Admitting: Physical Therapy

## 2021-07-02 NOTE — Therapy (Signed)
[facility] [address] [place], [place], [address] Phone: [phone]   Fax:  [phone]  Physical Therapy Treatment  Patient Details  Name: Caitlin Martinez MRN: [id number] Date of Birth: 1971/03/27 Referring Provider (PT): Cammy Copa MD   Encounter Date: 07/01/2021   PT End of Session - 07/01/21 1417     Visit Number 8    Number of Visits 16    Date for PT Re-Evaluation 07/24/21    Authorization Type BCBS 2022 52 visits    PT Start Time 1348    PT Stop Time 1428    PT Time Calculation (min) 40 min    Activity Tolerance Patient tolerated treatment well    Behavior During Therapy [facility] for tasks assessed/performed             Past Medical History:  Diagnosis Date   Migraines     Past Surgical History:  Procedure Laterality Date   CHOLECYSTECTOMY     PLACEMENT OF BREAST IMPLANTS      There were no vitals filed for this visit.   Subjective Assessment - 07/01/21 1415     Subjective Patient reports that it is getting better. It is still achy but it feels different. She feels like she is having spasming in her upper trap.    Pertinent History 3 weeks out of surgery    Limitations Lifting;House hold activities    How long can you sit comfortably? N/A    How long can you stand comfortably? N/A    How long can you walk comfortably? N/A    Diagnostic tests nothing post-op    Patient Stated Goals to return to active lifestyle without restrictions    Currently in Pain? Yes    Pain Score 4     Pain Location Shoulder    Pain Orientation Right    Pain Descriptors / Indicators Aching    Pain Type Chronic pain    Pain Onset 1 to 4 weeks ago    Pain Frequency Intermittent    Aggravating Factors  use of the arm    Pain Relieving Factors medication    Effect of Pain on Daily Activities difficulty perfroming ADL's    Multiple Pain Sites No                               [facility]  Adult PT Treatment/Exercise - 07/02/21 0001       Shoulder Exercises: Supine   Other Supine Exercises AAROM FLEXION WITH CANE 3X10; 2lb cane    Other Supine Exercises wand er 2 x10;      Shoulder Exercises: Sidelying   Other Sidelying Exercises SLER to neutral 3x10      Shoulder Exercises: Standing   Theraband Level (Shoulder Internal Rotation) Level 1 (Yellow)    Internal Rotation Limitations 3x10    Extension Limitations to neutral yellow band 3x10;    Row Limitations 3x10 red    Other Standing Exercises finger ladder 3x and 2x      Shoulder Exercises: Pulleys   Flexion Limitations 2 min    Scaption Limitations 2 min      Manual Therapy   Manual Therapy Joint mobilization    Joint Mobilization light grade 1 and II mobilizations of the shoulder but patient had pain in the area of the scar.    Soft tissue mobilization to upper trap    Passive ROM into ER /IR  and flexion with light distraction                     PT Education - 07/01/21 1416     Education Details reviewed progression of activity    Person(s) Educated Patient    Methods Explanation;Demonstration;Tactile cues;Verbal cues    Comprehension Verbalized understanding;Returned demonstration;Verbal cues required;Tactile cues required              PT Short Term Goals - 05/29/21 1037       PT SHORT TERM GOAL #1   Title Patient will increase passive ER to 75 degrees    Time 3    Period Weeks    Status New    Target Date 06/19/21      PT SHORT TERM GOAL #2   Title Patient will incrrease passive shoulder flexion to 120 degrees    Time 3    Period Weeks    Status New    Target Date 06/19/21      PT SHORT TERM GOAL #3   Title Patient will be indepdnent with a base scpaular strengthening and isomtric program    Time 3    Period Weeks    Status New    Target Date 06/19/21               PT Long Term Goals - 05/29/21 1044       PT LONG TERM GOAL #1   Title Patient will reach overhead  to a shelf without increased pain    Time 8    Period Weeks    Status New    Target Date 07/24/21      PT LONG TERM GOAL #2   Title Patient will reach behind her head to do her hair without pain    Time 8    Period Weeks    Status New    Target Date 07/24/21      PT LONG TERM GOAL #3   Title Patient will reach behind her back to L4 without pain in order tp perfrom self care tasks    Time 8    Period Weeks    Status New    Target Date 07/24/21                   Plan - 07/01/21 1418     Clinical Impression Statement Patient had significant spasming in her right upper trap and into the cervical paraspinals. Therapy perfroemd manual therapy to decrease spasming. She tolerated ther-ex well. Therapy advanaced wand weight to 2lb. Therapy also advanced her band exercises reps. She was advised to continue to not do a lot of reaching overhead until she gets stronger.    Personal Factors and Comorbidities Comorbidity 1    Comorbidities migranes    Examination-Activity Limitations Dressing    Stability/Clinical Decision Making Stable/Uncomplicated    Clinical Decision Making Low    Rehab Potential Excellent    PT Frequency 2x / week    PT Treatment/Interventions ADLs/Self Care Home Management;Aquatic Therapy;Cryotherapy;Moist Heat;Functional mobility training;Therapeutic activities;Therapeutic exercise;Neuromuscular re-education;Patient/family education;Scar mobilization;Passive range of motion;Energy conservation;Dry needling;Splinting;Taping;Vasopneumatic Device;Joint Manipulations    PT Next Visit Plan Continue with improving range of motion in shoulder flexion and shoulder external rotation. Progress to AAROM at 4 weeks. Propress to Isomtrics and scpaular strengthening at 5-6 weeks. Follow SOS protocol. Moidalities PRN. Advised to ice at home after PT sessions.    PT Home Exercise Plan Cane assisted external rotation, traps shrug relax, pendulum  [id number] see updated HEP     Consulted and Agree with Plan of Care Patient             Patient will benefit from skilled therapeutic intervention in order to improve the following deficits and impairments:  Decreased endurance, Decreased range of motion, Decreased safety awareness, Increased fascial restricitons, Pain, Increased muscle spasms, Impaired UE functional use, Decreased strength  Visit Diagnosis: Stiffness of right shoulder, not elsewhere classified  Acute pain of right shoulder  Muscle weakness (generalized)     Problem List Patient Active Problem List   Diagnosis Date Noted   DEPRESSION 12/09/2007   MIGRAINE, CHRONIC 12/09/2007   ESOPHAGITIS 12/09/2007   GERD 12/09/2007   PEPTIC ULCER DISEASE 12/09/2007   DYSPEPSIA 12/09/2007   DIARRHEA 12/09/2007   CHOLELITHIASIS, HX OF 12/09/2007    [name], PT 07/02/2021, 12:13 PM  [facility] [address] [place], [place], [address] Phone: [phone]   Fax:  [phone]  Name: KIRAT MEZQUITA MRN: [id number] Date of Birth: 06-Oct-1971

## 2021-07-04 ENCOUNTER — Telehealth: Payer: Self-pay | Admitting: Surgical

## 2021-07-04 ENCOUNTER — Ambulatory Visit (HOSPITAL_BASED_OUTPATIENT_CLINIC_OR_DEPARTMENT_OTHER): Payer: BC Managed Care – PPO | Admitting: Physical Therapy

## 2021-07-04 ENCOUNTER — Other Ambulatory Visit: Payer: Self-pay

## 2021-07-04 DIAGNOSIS — M6281 Muscle weakness (generalized): Secondary | ICD-10-CM | POA: Diagnosis not present

## 2021-07-04 DIAGNOSIS — M25611 Stiffness of right shoulder, not elsewhere classified: Secondary | ICD-10-CM | POA: Diagnosis not present

## 2021-07-04 DIAGNOSIS — M25511 Pain in right shoulder: Secondary | ICD-10-CM | POA: Diagnosis not present

## 2021-07-04 NOTE — Telephone Encounter (Signed)
IC,spoke with patient. Advised form faxed and will mail copy to her. Also advised no charge for form and she asked the check she made to [employer] be mailed along with form. This was done.

## 2021-07-05 NOTE — Therapy (Signed)
[facility] [address] [place], [place], [address] Phone: [phone]   Fax:  [phone]  Physical Therapy Treatment  Patient Details  Name: Caitlin Martinez MRN: [id number] Date of Birth: 23-Dec-1970 Referring Provider (PT): Cammy Copa MD   Encounter Date: 07/04/2021   PT End of Session - 07/05/21 0957     Visit Number 9    Number of Visits 16    Date for PT Re-Evaluation 07/24/21    Authorization Type BCBS 2022 52 visits    PT Start Time 1230    PT Stop Time 1327    PT Time Calculation (min) 57 min    Activity Tolerance Patient tolerated treatment well    Behavior During Therapy [facility] for tasks assessed/performed             Past Medical History:  Diagnosis Date   Migraines     Past Surgical History:  Procedure Laterality Date   CHOLECYSTECTOMY     PLACEMENT OF BREAST IMPLANTS      There were no vitals filed for this visit.   Subjective Assessment - 07/05/21 0956     Subjective Patient reports minor soreness yeasterday after running, but overall she is doing better. She has been working o her exercises.    Pertinent History 3 weeks out of surgery    Limitations Lifting;House hold activities    How long can you sit comfortably? N/A    How long can you stand comfortably? N/A    How long can you walk comfortably? N/A    Diagnostic tests nothing post-op    Patient Stated Goals to return to active lifestyle without restrictions    Currently in Pain? No/denies                               [facility] Adult PT Treatment/Exercise - 07/05/21 0001       Shoulder Exercises: Supine   Other Supine Exercises AAROM FLEXION WITH CANE 3X10; 2lb cane      Shoulder Exercises: Sidelying   Other Sidelying Exercises SLER to neutral 3x10 1lb      Shoulder Exercises: Standing   Theraband Level (Shoulder Internal Rotation) Level 1 (Yellow)    Internal Rotation Limitations 3x10     Extension Limitations to neutral green  band 3x10;    Row Limitations 3x10 green    Other Standing Exercises finger ladder 6x      Shoulder Exercises: Pulleys   Flexion Limitations 2 min    Scaption Limitations 2 min      Modalities   Modalities Vasopneumatic      Vasopneumatic   Number Minutes Vasopneumatic  15 minutes    Vasopnuematic Location  Shoulder    Vasopneumatic Pressure Medium    Vasopneumatic Temperature  32      Manual Therapy   Manual Therapy Joint mobilization    Joint Mobilization light grade 1 and II mobilizations of the shoulder but patient had pain in the area of the scar.    Soft tissue mobilization to upper trap    Passive ROM into ER /IR and flexion with light distraction                     PT Education - 07/05/21 0957     Education Details activity progression    Person(s) Educated Patient    Methods Explanation;Demonstration;Tactile cues;Verbal cues    Comprehension Verbal cues required;Returned  demonstration;Verbalized understanding;Tactile cues required              PT Short Term Goals - 07/05/21 1002       PT SHORT TERM GOAL #1   Title Patient will increase passive ER to 75 degrees    Baseline full passive per visual inspection    Time 3    Period Weeks    Status On-going    Target Date 06/19/21      PT SHORT TERM GOAL #2   Title Patient will incrrease passive shoulder flexion to 120 degrees    Baseline 140    Time 3    Period Weeks    Status Achieved    Target Date 06/19/21      PT SHORT TERM GOAL #3   Title Patient will be indepdnent with a base scpaular strengthening and isomtric program    Time 3    Period Weeks    Status Achieved    Target Date 06/19/21               PT Long Term Goals - 05/29/21 1044       PT LONG TERM GOAL #1   Title Patient will reach overhead to a shelf without increased pain    Time 8    Period Weeks    Status New    Target Date 07/24/21      PT LONG TERM GOAL #2   Title  Patient will reach behind her head to do her hair without pain    Time 8    Period Weeks    Status New    Target Date 07/24/21      PT LONG TERM GOAL #3   Title Patient will reach behind her back to L4 without pain in order tp perfrom self care tasks    Time 8    Period Weeks    Status New    Target Date 07/24/21                   Plan - 07/05/21 0958     Clinical Impression Statement Patient is doing well. She had 140 degrees of active flexion upon inspection today. She reports it feels weak. She was advised it is weak, but it will continue to improve. Therapy advanced her exercises today. She was advised to ice if she gets more sore later. Therapy used vaso device to reduce any potential inflammation. She was advised to continue with her exercises. We will likley progress her to supine flexion next week as we continue with the lawnchair progression.    Personal Factors and Comorbidities Comorbidity 1    Comorbidities migranes    Examination-Activity Limitations Dressing    Examination-Participation Restrictions Community Activity;Laundry;Yard Work;Volunteer;Cleaning    Stability/Clinical Decision Making Stable/Uncomplicated    Clinical Decision Making Low    Rehab Potential Excellent    PT Frequency 2x / week    PT Duration 8 weeks    PT Treatment/Interventions ADLs/Self Care Home Management;Aquatic Therapy;Cryotherapy;Moist Heat;Functional mobility training;Therapeutic activities;Therapeutic exercise;Neuromuscular re-education;Patient/family education;Scar mobilization;Passive range of motion;Energy conservation;Dry needling;Splinting;Taping;Vasopneumatic Device;Joint Manipulations    PT Next Visit Plan Continue with improving range of motion in shoulder flexion and shoulder external rotation. Progress to AAROM at 4 weeks. Propress to Isomtrics and scpaular strengthening at 5-6 weeks. Follow SOS protocol. Moidalities PRN. Advised to ice at home after PT sessions.    PT Home  Exercise Plan Cane assisted external rotation, traps shrug relax, pendulum  [id number] see updated  HEP    Consulted and Agree with Plan of Care Patient             Patient will benefit from skilled therapeutic intervention in order to improve the following deficits and impairments:  Decreased endurance, Decreased range of motion, Decreased safety awareness, Increased fascial restricitons, Pain, Increased muscle spasms, Impaired UE functional use, Decreased strength  Visit Diagnosis: Stiffness of right shoulder, not elsewhere classified  Acute pain of right shoulder  Muscle weakness (generalized)     Problem List Patient Active Problem List   Diagnosis Date Noted   DEPRESSION 12/09/2007   MIGRAINE, CHRONIC 12/09/2007   ESOPHAGITIS 12/09/2007   GERD 12/09/2007   PEPTIC ULCER DISEASE 12/09/2007   DYSPEPSIA 12/09/2007   DIARRHEA 12/09/2007   CHOLELITHIASIS, HX OF 12/09/2007    [name], PT 07/05/2021, 10:04 AM  [facility] [address] [place], [place], [address] Phone: [phone]   Fax:  [phone]  Name: Caitlin Martinez MRN: [id number] Date of Birth: 29-Jul-1971

## 2021-07-09 ENCOUNTER — Other Ambulatory Visit: Payer: Self-pay

## 2021-07-09 ENCOUNTER — Ambulatory Visit (HOSPITAL_BASED_OUTPATIENT_CLINIC_OR_DEPARTMENT_OTHER): Payer: BC Managed Care – PPO | Admitting: Physical Therapy

## 2021-07-09 DIAGNOSIS — M25511 Pain in right shoulder: Secondary | ICD-10-CM | POA: Diagnosis not present

## 2021-07-09 DIAGNOSIS — M6281 Muscle weakness (generalized): Secondary | ICD-10-CM | POA: Diagnosis not present

## 2021-07-09 DIAGNOSIS — M25611 Stiffness of right shoulder, not elsewhere classified: Secondary | ICD-10-CM | POA: Diagnosis not present

## 2021-07-10 ENCOUNTER — Encounter (HOSPITAL_BASED_OUTPATIENT_CLINIC_OR_DEPARTMENT_OTHER): Payer: Self-pay | Admitting: Physical Therapy

## 2021-07-10 NOTE — Therapy (Signed)
[facility] [address] [place], [place], [address] Phone: [phone]   Fax:  [phone]  Physical Therapy Treatment  Patient Details  Name: Caitlin Martinez MRN: [id number] Date of Birth: Dec 05, 1970 Referring Provider (PT): Cammy Copa MD   Encounter Date: 07/09/2021   PT End of Session - 07/10/21 0810     Visit Number 10    Number of Visits 16    Date for PT Re-Evaluation 07/24/21    Authorization Type BCBS 2022 52 visits    PT Start Time 1300    PT Stop Time 1342    PT Time Calculation (min) 42 min    Activity Tolerance Patient tolerated treatment well    Behavior During Therapy [facility] for tasks assessed/performed             Past Medical History:  Diagnosis Date   Migraines     Past Surgical History:  Procedure Laterality Date   CHOLECYSTECTOMY     PLACEMENT OF BREAST IMPLANTS      There were no vitals filed for this visit.   Subjective Assessment - 07/10/21 0809     Subjective Patient reports she mowed over the weekend and had very little pain. She feels like overall she is doing well. Shestill feels like it is heavey, but she is having very little pain.    Pertinent History 3 weeks out of surgery    Limitations Lifting;House hold activities    How long can you sit comfortably? N/A    How long can you stand comfortably? N/A    How long can you walk comfortably? N/A    Currently in Pain? No/denies                               [facility] Adult PT Treatment/Exercise - 07/10/21 0001       Shoulder Exercises: Supine   Other Supine Exercises AAROM FLEXION WITH CANE 3X10; 2lb cane    Other Supine Exercises active flexion 2x10; supine ABC 2x10      Shoulder Exercises: Sidelying   Other Sidelying Exercises SLER to neutral 3x10 1lb      Shoulder Exercises: Standing   Theraband Level (Shoulder Internal Rotation) Level 1 (Yellow);Level 2 (Red)    Internal Rotation  Limitations 3x10    Extension Limitations to neutral green  band 3x10;    Row Limitations 3x10 green    Other Standing Exercises finger ladder 6x      Shoulder Exercises: Pulleys   Flexion Limitations 2 min    Scaption Limitations 2 min      Manual Therapy   Manual Therapy Joint mobilization    Joint Mobilization light grade 1 and II mobilizations of the shoulder but patient had pain in the area of the scar.    Soft tissue mobilization to upper trap    Passive ROM into ER /IR and flexion with light distraction                     PT Education - 07/10/21 0810     Education Details reviewed HEp and symptom managment    Person(s) Educated Patient    Methods Explanation;Demonstration;Tactile cues;Verbal cues    Comprehension Verbalized understanding;Returned demonstration;Verbal cues required;Tactile cues required              PT Short Term Goals - 07/05/21 1002       PT SHORT TERM GOAL #  1   Title Patient will increase passive ER to 75 degrees    Baseline full passive per visual inspection    Time 3    Period Weeks    Status On-going    Target Date 06/19/21      PT SHORT TERM GOAL #2   Title Patient will incrrease passive shoulder flexion to 120 degrees    Baseline 140    Time 3    Period Weeks    Status Achieved    Target Date 06/19/21      PT SHORT TERM GOAL #3   Title Patient will be indepdnent with a base scpaular strengthening and isomtric program    Time 3    Period Weeks    Status Achieved    Target Date 06/19/21               PT Long Term Goals - 05/29/21 1044       PT LONG TERM GOAL #1   Title Patient will reach overhead to a shelf without increased pain    Time 8    Period Weeks    Status New    Target Date 07/24/21      PT LONG TERM GOAL #2   Title Patient will reach behind her head to do her hair without pain    Time 8    Period Weeks    Status New    Target Date 07/24/21      PT LONG TERM GOAL #3   Title Patient will  reach behind her back to L4 without pain in order tp perfrom self care tasks    Time 8    Period Weeks    Status New    Target Date 07/24/21                   Plan - 07/09/21 1344     Clinical Impression Statement Therapy advanced the patients exercisee. She perfromed mid range stabilization in supine and supine active shoulder flexion. She had minr pain into her biceps. She is concerned about the crepitus in her shoulder, but she has only just started active strengthening because of her set back. Therapy udated her HEP>    Personal Factors and Comorbidities Comorbidity 1    Comorbidities migranes    Examination-Activity Limitations Dressing    Examination-Participation Restrictions Community Activity;Laundry;Yard Work;Volunteer;Cleaning    Stability/Clinical Decision Making Stable/Uncomplicated    Clinical Decision Making Low    Rehab Potential Excellent    PT Frequency 2x / week    PT Duration 8 weeks    PT Treatment/Interventions ADLs/Self Care Home Management;Aquatic Therapy;Cryotherapy;Moist Heat;Functional mobility training;Therapeutic activities;Therapeutic exercise;Neuromuscular re-education;Patient/family education;Scar mobilization;Passive range of motion;Energy conservation;Dry needling;Splinting;Taping;Vasopneumatic Device;Joint Manipulations    PT Next Visit Plan Continue with improving range of motion in shoulder flexion and shoulder external rotation. Progress to AAROM at 4 weeks. Propress to Isomtrics and scpaular strengthening at 5-6 weeks. Follow SOS protocol. Moidalities PRN. Advised to ice at home after PT sessions.    PT Home Exercise Plan Cane assisted external rotation, traps shrug relax, pendulum  [id number] see updated HEP    Consulted and Agree with Plan of Care Patient             Patient will benefit from skilled therapeutic intervention in order to improve the following deficits and impairments:  Decreased endurance, Decreased range of motion,  Decreased safety awareness, Increased fascial restricitons, Pain, Increased muscle spasms, Impaired UE functional use, Decreased strength  Visit  Diagnosis: Stiffness of right shoulder, not elsewhere classified  Acute pain of right shoulder  Muscle weakness (generalized)     Problem List Patient Active Problem List   Diagnosis Date Noted   DEPRESSION 12/09/2007   MIGRAINE, CHRONIC 12/09/2007   ESOPHAGITIS 12/09/2007   GERD 12/09/2007   PEPTIC ULCER DISEASE 12/09/2007   DYSPEPSIA 12/09/2007   DIARRHEA 12/09/2007   CHOLELITHIASIS, HX OF 12/09/2007    [name], PT 07/10/2021, 1:09 PM  [facility] [address] [place], [place], [address] Phone: [phone]   Fax:  [phone]  Name: Caitlin Martinez MRN: [id number] Date of Birth: 06-02-1971

## 2021-07-11 ENCOUNTER — Encounter (HOSPITAL_BASED_OUTPATIENT_CLINIC_OR_DEPARTMENT_OTHER): Payer: Self-pay | Admitting: Physical Therapy

## 2021-07-11 ENCOUNTER — Other Ambulatory Visit: Payer: Self-pay

## 2021-07-11 ENCOUNTER — Ambulatory Visit (HOSPITAL_BASED_OUTPATIENT_CLINIC_OR_DEPARTMENT_OTHER): Payer: BC Managed Care – PPO | Admitting: Physical Therapy

## 2021-07-11 DIAGNOSIS — M25511 Pain in right shoulder: Secondary | ICD-10-CM | POA: Diagnosis not present

## 2021-07-11 DIAGNOSIS — M6281 Muscle weakness (generalized): Secondary | ICD-10-CM

## 2021-07-11 DIAGNOSIS — M25611 Stiffness of right shoulder, not elsewhere classified: Secondary | ICD-10-CM | POA: Diagnosis not present

## 2021-07-11 NOTE — Therapy (Signed)
[facility] [address] [place], [place], [address] Phone: [phone]   Fax:  [phone]  Physical Therapy Treatment  Patient Details  Name: Caitlin Martinez MRN: [id number] Date of Birth: [birth year] Referring Provider (PT): Cammy Copa MD   Encounter Date: 07/11/2021   PT End of Session - 07/11/21 0808     Visit Number 11    Number of Visits 16    Date for PT Re-Evaluation 07/24/21    Authorization Type BCBS 2022 52 visits    PT Start Time 0800    PT Stop Time 0840    PT Time Calculation (min) 40 min    Activity Tolerance Patient tolerated treatment well    Behavior During Therapy [facility] for tasks assessed/performed             Past Medical History:  Diagnosis Date   Migraines     Past Surgical History:  Procedure Laterality Date   CHOLECYSTECTOMY     PLACEMENT OF BREAST IMPLANTS      There were no vitals filed for this visit.   Subjective Assessment - 07/11/21 0804     Subjective Pateint had no signficant pain after the last visit. She moved some studf yesterday. She can feel it but overall it is good.    Pertinent History 3 weeks out of surgery    Limitations Lifting;House hold activities    How long can you stand comfortably? N/A    How long can you walk comfortably? N/A    Diagnostic tests nothing post-op    Patient Stated Goals to return to active lifestyle without restrictions    Currently in Pain? No/denies                               [facility] Adult PT Treatment/Exercise - 07/11/21 0001       Shoulder Exercises: Supine   Other Supine Exercises AAROM FLEXION WITH CANE 3X10; 2lb cane    Other Supine Exercises active flexion 2x10; supine ABC 2x10      Shoulder Exercises: Sidelying   Other Sidelying Exercises SLER to neutral 3x10 1lb      Shoulder Exercises: Standing   External Rotation 15 reps   2x15 red   Theraband Level (Shoulder Internal Rotation)  Level 1 (Yellow);Level 2 (Red)    Internal Rotation Limitations 3x10    Extension Limitations to neutral blue band 3x10;    Row Limitations 3x10 blue    Other Standing Exercises finger ladder 6x      Shoulder Exercises: Pulleys   Flexion Limitations 2 min    Scaption Limitations 2 min      Manual Therapy   Manual Therapy Joint mobilization    Joint Mobilization light grade 1 and II mobilizations of the shoulder but patient had pain in the area of the scar.    Soft tissue mobilization to upper trap    Passive ROM into ER /IR and flexion with light distraction                     PT Education - 07/11/21 0807     Education Details reviewed exercises    Person(s) Educated Patient    Methods Explanation;Demonstration;Verbal cues;Tactile cues    Comprehension Verbalized understanding;Returned demonstration;Verbal cues required;Tactile cues required              PT Short Term Goals - 07/05/21 1002  PT SHORT TERM GOAL #1   Title Patient will increase passive ER to 75 degrees    Baseline full passive per visual inspection    Time 3    Period Weeks    Status On-going    Target Date 06/19/21      PT SHORT TERM GOAL #2   Title Patient will incrrease passive shoulder flexion to 120 degrees    Baseline 140    Time 3    Period Weeks    Status Achieved    Target Date 06/19/21      PT SHORT TERM GOAL #3   Title Patient will be indepdnent with a base scpaular strengthening and isomtric program    Time 3    Period Weeks    Status Achieved    Target Date 06/19/21               PT Long Term Goals - 05/29/21 1044       PT LONG TERM GOAL #1   Title Patient will reach overhead to a shelf without increased pain    Time 8    Period Weeks    Status New    Target Date 07/24/21      PT LONG TERM GOAL #2   Title Patient will reach behind her head to do her hair without pain    Time 8    Period Weeks    Status New    Target Date 07/24/21      PT LONG  TERM GOAL #3   Title Patient will reach behind her back to L4 without pain in order tp perfrom self care tasks    Time 8    Period Weeks    Status New    Target Date 07/24/21                   Plan - 07/11/21 3428     Clinical Impression Statement Patient is making great progress. She had no significant pain with treatment. Therapy added band ER without difficulty. She will continue her HEP    Personal Factors and Comorbidities Comorbidity 1    Comorbidities migranes    Examination-Activity Limitations Dressing    Examination-Participation Restrictions Community Activity;Laundry;Yard Work;Volunteer;Cleaning    Stability/Clinical Decision Making Stable/Uncomplicated    Clinical Decision Making Low    Rehab Potential Excellent    PT Frequency 2x / week    PT Duration 8 weeks    PT Treatment/Interventions ADLs/Self Care Home Management;Aquatic Therapy;Cryotherapy;Moist Heat;Functional mobility training;Therapeutic activities;Therapeutic exercise;Neuromuscular re-education;Patient/family education;Scar mobilization;Passive range of motion;Energy conservation;Dry needling;Splinting;Taping;Vasopneumatic Device;Joint Manipulations    PT Next Visit Plan Continue with improving range of motion in shoulder flexion and shoulder external rotation. Progress to AAROM at 4 weeks. Propress to Isomtrics and scpaular strengthening at 5-6 weeks. Follow SOS protocol. Moidalities PRN. Advised to ice at home after PT sessions.    PT Home Exercise Plan Cane assisted external rotation, traps shrug relax, pendulum  [id number] see updated HEP    Consulted and Agree with Plan of Care Patient             Patient will benefit from skilled therapeutic intervention in order to improve the following deficits and impairments:  Decreased endurance, Decreased range of motion, Decreased safety awareness, Increased fascial restricitons, Pain, Increased muscle spasms, Impaired UE functional use, Decreased  strength  Visit Diagnosis: Stiffness of right shoulder, not elsewhere classified  Acute pain of right shoulder  Muscle weakness (generalized)     Problem  List Patient Active Problem List   Diagnosis Date Noted   DEPRESSION 12/09/2007   MIGRAINE, CHRONIC 12/09/2007   ESOPHAGITIS 12/09/2007   GERD 12/09/2007   PEPTIC ULCER DISEASE 12/09/2007   DYSPEPSIA 12/09/2007   DIARRHEA 12/09/2007   CHOLELITHIASIS, HX OF 12/09/2007    [name], PT 07/11/2021, 8:43 AM  [facility] [address] [place], [place], [address] Phone: [phone]   Fax:  [phone]  Name: Caitlin Martinez MRN: [id number] Date of Birth: [birth year]

## 2021-07-16 ENCOUNTER — Telehealth: Payer: Self-pay | Admitting: Orthopedic Surgery

## 2021-07-16 ENCOUNTER — Ambulatory Visit (HOSPITAL_BASED_OUTPATIENT_CLINIC_OR_DEPARTMENT_OTHER): Payer: BC Managed Care – PPO | Admitting: Physical Therapy

## 2021-07-16 ENCOUNTER — Other Ambulatory Visit: Payer: Self-pay

## 2021-07-16 DIAGNOSIS — M25611 Stiffness of right shoulder, not elsewhere classified: Secondary | ICD-10-CM

## 2021-07-16 DIAGNOSIS — M25511 Pain in right shoulder: Secondary | ICD-10-CM

## 2021-07-16 DIAGNOSIS — M6281 Muscle weakness (generalized): Secondary | ICD-10-CM | POA: Diagnosis not present

## 2021-07-16 NOTE — Telephone Encounter (Signed)
Patient called. Says she has a form that needs to be signed today for her job. They are accommodating her but the form must be in to her HR tomorrow. Her number is [phone]

## 2021-07-17 ENCOUNTER — Encounter (HOSPITAL_BASED_OUTPATIENT_CLINIC_OR_DEPARTMENT_OTHER): Payer: Self-pay | Admitting: Physical Therapy

## 2021-07-17 NOTE — Therapy (Signed)
[facility] [address] [place], [place], [address] Phone: [phone]   Fax:  [phone]  Physical Therapy Treatment  Patient Details  Name: Caitlin Martinez MRN: [id number] Date of Birth: 1971-09-28 Referring Provider (PT): Cammy Copa MD   Encounter Date: 07/16/2021   PT End of Session - 07/17/21 1712     Visit Number 12    Number of Visits 16    Date for PT Re-Evaluation 07/24/21    Authorization Type BCBS 2022 52 visits    PT Start Time 1300    PT Stop Time 1341    PT Time Calculation (min) 41 min    Activity Tolerance Patient tolerated treatment well    Behavior During Therapy [facility] for tasks assessed/performed             Past Medical History:  Diagnosis Date   Migraines     Past Surgical History:  Procedure Laterality Date   CHOLECYSTECTOMY     PLACEMENT OF BREAST IMPLANTS      There were no vitals filed for this visit.   Subjective Assessment - 07/17/21 1708     Subjective Patient continues to have no complaints. She had no pain after the last visit. She has been running and perfroming her home exercises.    Patient is accompained by: Family member    Pertinent History 3 weeks out of surgery    Limitations Lifting;House hold activities    How long can you sit comfortably? N/A    How long can you stand comfortably? N/A    How long can you walk comfortably? N/A    Diagnostic tests nothing post-op    Patient Stated Goals to return to active lifestyle without restrictions    Currently in Pain? No/denies    Pain Score 4     Pain Location Shoulder    Pain Orientation Right    Pain Descriptors / Indicators Aching    Pain Type Chronic pain    Pain Onset 1 to 4 weeks ago    Pain Frequency Intermittent    Aggravating Factors  use af the arm    Pain Relieving Factors medication    Effect of Pain on Daily Activities difficulty performing ADLs    Multiple Pain Sites No                                [facility] Adult PT Treatment/Exercise - 07/17/21 0001       Shoulder Exercises: Supine   Other Supine Exercises AAROM FLEXION WITH CANE 3X10; 3lb cane    Other Supine Exercises active flexion 2x10 1lb ; supine ABC 2x10 1lb supine scpatiuon 1lb      Shoulder Exercises: Sidelying   Other Sidelying Exercises SLER to neutral 3x10 1lb      Shoulder Exercises: Standing   External Rotation 15 reps   2x15 red   Theraband Level (Shoulder Internal Rotation) Level 1 (Yellow);Level 2 (Red)    Internal Rotation Limitations 3x10    Extension Limitations to neutral blue band 3x10;    Row Limitations 3x10 blue    Other Standing Exercises wall wash 3x10    Other Standing Exercises finger ladder 6x      Shoulder Exercises: Pulleys   Flexion Limitations 2 min    Scaption Limitations 2 min      Manual Therapy   Manual Therapy Joint mobilization    Soft tissue mobilization to  upper trap                       PT Short Term Goals - 07/05/21 1002       PT SHORT TERM GOAL #1   Title Patient will increase passive ER to 75 degrees    Baseline full passive per visual inspection    Time 3    Period Weeks    Status On-going    Target Date 06/19/21      PT SHORT TERM GOAL #2   Title Patient will incrrease passive shoulder flexion to 120 degrees    Baseline 140    Time 3    Period Weeks    Status Achieved    Target Date 06/19/21      PT SHORT TERM GOAL #3   Title Patient will be indepdnent with a base scpaular strengthening and isomtric program    Time 3    Period Weeks    Status Achieved    Target Date 06/19/21               PT Long Term Goals - 05/29/21 1044       PT LONG TERM GOAL #1   Title Patient will reach overhead to a shelf without increased pain    Time 8    Period Weeks    Status New    Target Date 07/24/21      PT LONG TERM GOAL #2   Title Patient will reach behind her head to do her hair without pain    Time 8     Period Weeks    Status New    Target Date 07/24/21      PT LONG TERM GOAL #3   Title Patient will reach behind her back to L4 without pain in order tp perfrom self care tasks    Time 8    Period Weeks    Status New    Target Date 07/24/21                   Plan - 07/17/21 1713     Clinical Impression Statement Therapy advanced weight with wand flexion. She continued with weight with IR ER. She started very light gym exercises for scapular exercises. She tolerated well. Therapy will continue to progress as tolerated. She was advised if she trieds the gym exercises on her own to srt very light.    Personal Factors and Comorbidities Comorbidity 1    Comorbidities migranes    Examination-Activity Limitations Dressing    Examination-Participation Restrictions Community Activity;Laundry;Yard Work;Volunteer;Cleaning    Stability/Clinical Decision Making Stable/Uncomplicated    Clinical Decision Making Low    Rehab Potential Excellent    PT Frequency 2x / week    PT Duration 8 weeks    PT Treatment/Interventions ADLs/Self Care Home Management;Aquatic Therapy;Cryotherapy;Moist Heat;Functional mobility training;Therapeutic activities;Therapeutic exercise;Neuromuscular re-education;Patient/family education;Scar mobilization;Passive range of motion;Energy conservation;Dry needling;Splinting;Taping;Vasopneumatic Device;Joint Manipulations    PT Next Visit Plan Continue with improving range of motion in shoulder flexion and shoulder external rotation. Progress to AAROM at 4 weeks. Propress to Isomtrics and scpaular strengthening at 5-6 weeks. Follow SOS protocol. Moidalities PRN. Advised to ice at home after PT sessions.    PT Home Exercise Plan Cane assisted external rotation, traps shrug relax, pendulum  [id number] see updated HEP    Consulted and Agree with Plan of Care Patient             Patient will benefit  from skilled therapeutic intervention in order to improve the following  deficits and impairments:  Decreased endurance, Decreased range of motion, Decreased safety awareness, Increased fascial restricitons, Pain, Increased muscle spasms, Impaired UE functional use, Decreased strength  Visit Diagnosis: Stiffness of right shoulder, not elsewhere classified  Acute pain of right shoulder  Muscle weakness (generalized)     Problem List Patient Active Problem List   Diagnosis Date Noted   DEPRESSION 12/09/2007   MIGRAINE, CHRONIC 12/09/2007   ESOPHAGITIS 12/09/2007   GERD 12/09/2007   PEPTIC ULCER DISEASE 12/09/2007   DYSPEPSIA 12/09/2007   DIARRHEA 12/09/2007   CHOLELITHIASIS, HX OF 12/09/2007    [name], PT 07/17/2021, 5:22 PM  [facility] [address] [place], [place], [address] Phone: [phone]   Fax:  [phone]  Name: JACELYNN HAYTON MRN: [id number] Date of Birth: 1971-01-02

## 2021-07-18 ENCOUNTER — Encounter (HOSPITAL_BASED_OUTPATIENT_CLINIC_OR_DEPARTMENT_OTHER): Payer: Self-pay | Admitting: Physical Therapy

## 2021-07-18 ENCOUNTER — Other Ambulatory Visit: Payer: Self-pay

## 2021-07-18 ENCOUNTER — Ambulatory Visit (HOSPITAL_BASED_OUTPATIENT_CLINIC_OR_DEPARTMENT_OTHER): Payer: BC Managed Care – PPO | Admitting: Physical Therapy

## 2021-07-18 DIAGNOSIS — M25611 Stiffness of right shoulder, not elsewhere classified: Secondary | ICD-10-CM | POA: Diagnosis not present

## 2021-07-18 DIAGNOSIS — M25511 Pain in right shoulder: Secondary | ICD-10-CM

## 2021-07-18 DIAGNOSIS — M6281 Muscle weakness (generalized): Secondary | ICD-10-CM | POA: Diagnosis not present

## 2021-07-19 ENCOUNTER — Encounter (HOSPITAL_BASED_OUTPATIENT_CLINIC_OR_DEPARTMENT_OTHER): Payer: Self-pay | Admitting: Physical Therapy

## 2021-07-19 NOTE — Therapy (Signed)
[facility] [address] [place], [place], [address] Phone: [phone]   Fax:  [phone]  Physical Therapy Treatment  Patient Details  Name: Caitlin Martinez MRN: [id number] Date of Birth: July 14, 1971 Referring Provider (PT): Cammy Copa MD   Encounter Date: 07/18/2021   PT End of Session - 07/18/21 0813     Visit Number 13    Number of Visits 16    Date for PT Re-Evaluation 07/24/21    Authorization Type BCBS 2022 52 visits    PT Start Time 0803    PT Stop Time 0845    PT Time Calculation (min) 42 min    Activity Tolerance Patient tolerated treatment well    Behavior During Therapy [facility] for tasks assessed/performed             Past Medical History:  Diagnosis Date   Migraines     Past Surgical History:  Procedure Laterality Date   CHOLECYSTECTOMY     PLACEMENT OF BREAST IMPLANTS      There were no vitals filed for this visit.   Subjective Assessment - 07/18/21 0809     Subjective Patient reports she is a little sore htis morning. She thinks she slept on it over night.    Patient is accompained by: Family member    Pertinent History 3 weeks out of surgery    Limitations Lifting;House hold activities    How long can you sit comfortably? N/A    How long can you stand comfortably? N/A    How long can you walk comfortably? N/A    Diagnostic tests nothing post-op    Currently in Pain? Yes    Pain Score 2     Pain Location Shoulder    Pain Orientation Right    Pain Descriptors / Indicators Aching    Pain Type Chronic pain    Pain Onset 1 to 4 weeks ago    Pain Frequency Intermittent    Aggravating Factors  use of the arm    Pain Relieving Factors medication    Effect of Pain on Daily Activities difficulty perfroming ADL's                               [facility] Adult PT Treatment/Exercise - 07/19/21 0001       Shoulder Exercises: Supine   Other Supine Exercises  AAROM FLEXION WITH CANE 3X10; 3lb cane    Other Supine Exercises active flexion 2x10 1lb ; supine ABC 2x10 1lb supine scpatiuon 1lb      Shoulder Exercises: Sidelying   Other Sidelying Exercises SLER to neutral 3x10 1lb      Shoulder Exercises: Standing   External Rotation 15 reps   2x15 red   Theraband Level (Shoulder Internal Rotation) Level 2 (Red)    Internal Rotation Limitations 3x10    Extension Limitations to neutral blue band 3x10;    Row Limitations 3x10 blue    Other Standing Exercises wall wash 3x10    Other Standing Exercises finger ladder 6x      Shoulder Exercises: Pulleys   Flexion Limitations 2 min    Scaption Limitations 2 min      Manual Therapy   Manual Therapy Joint mobilization    Soft tissue mobilization to upper trap                     PT Education - 07/18/21 6789  Education Details Reviewed ther-ex    Person(s) Educated Patient    Methods Explanation;Demonstration;Tactile cues;Verbal cues    Comprehension Verbalized understanding;Returned demonstration;Verbal cues required;Tactile cues required              PT Short Term Goals - 07/05/21 1002       PT SHORT TERM GOAL #1   Title Patient will increase passive ER to 75 degrees    Baseline full passive per visual inspection    Time 3    Period Weeks    Status On-going    Target Date 06/19/21      PT SHORT TERM GOAL #2   Title Patient will incrrease passive shoulder flexion to 120 degrees    Baseline 140    Time 3    Period Weeks    Status Achieved    Target Date 06/19/21      PT SHORT TERM GOAL #3   Title Patient will be indepdnent with a base scpaular strengthening and isomtric program    Time 3    Period Weeks    Status Achieved    Target Date 06/19/21               PT Long Term Goals - 05/29/21 1044       PT LONG TERM GOAL #1   Title Patient will reach overhead to a shelf without increased pain    Time 8    Period Weeks    Status New    Target Date  07/24/21      PT LONG TERM GOAL #2   Title Patient will reach behind her head to do her hair without pain    Time 8    Period Weeks    Status New    Target Date 07/24/21      PT LONG TERM GOAL #3   Title Patient will reach behind her back to L4 without pain in order tp perfrom self care tasks    Time 8    Period Weeks    Status New    Target Date 07/24/21                   Plan - 07/18/21 0820     Clinical Impression Statement Despite being sore, the patient tolerated treatment well. The trigger point in her trap has improved. She was able to complete all of her exercises without difficulty. She has full active felxion at this time.    Personal Factors and Comorbidities Comorbidity 1    Comorbidities migranes    Examination-Activity Limitations Dressing    Examination-Participation Restrictions Community Activity;Laundry;Yard Work;Volunteer;Cleaning    Stability/Clinical Decision Making Stable/Uncomplicated    Clinical Decision Making Low    Rehab Potential Excellent    PT Frequency 2x / week    PT Duration 8 weeks    PT Treatment/Interventions ADLs/Self Care Home Management;Aquatic Therapy;Cryotherapy;Moist Heat;Functional mobility training;Therapeutic activities;Therapeutic exercise;Neuromuscular re-education;Patient/family education;Scar mobilization;Passive range of motion;Energy conservation;Dry needling;Splinting;Taping;Vasopneumatic Device;Joint Manipulations    PT Next Visit Plan Continue with improving range of motion in shoulder flexion and shoulder external rotation. Progress to AAROM at 4 weeks. Propress to Isomtrics and scpaular strengthening at 5-6 weeks. Follow SOS protocol. Moidalities PRN. Advised to ice at home after PT sessions.    PT Home Exercise Plan Cane assisted external rotation, traps shrug relax, pendulum  [id number] see updated HEP    Consulted and Agree with Plan of Care Patient  Patient will benefit from skilled therapeutic  intervention in order to improve the following deficits and impairments:  Decreased endurance, Decreased range of motion, Decreased safety awareness, Increased fascial restricitons, Pain, Increased muscle spasms, Impaired UE functional use, Decreased strength  Visit Diagnosis: Stiffness of right shoulder, not elsewhere classified  Acute pain of right shoulder  Muscle weakness (generalized)     Problem List Patient Active Problem List   Diagnosis Date Noted   DEPRESSION 12/09/2007   MIGRAINE, CHRONIC 12/09/2007   ESOPHAGITIS 12/09/2007   GERD 12/09/2007   PEPTIC ULCER DISEASE 12/09/2007   DYSPEPSIA 12/09/2007   DIARRHEA 12/09/2007   CHOLELITHIASIS, HX OF 12/09/2007    [name], PT 07/19/2021, 8:02 AM  [facility] [address] [place], [place], [address] Phone: [phone]   Fax:  [phone]  Name: Caitlin Martinez MRN: [id number] Date of Birth: May 29, 1971

## 2021-07-23 ENCOUNTER — Ambulatory Visit (HOSPITAL_BASED_OUTPATIENT_CLINIC_OR_DEPARTMENT_OTHER): Payer: BC Managed Care – PPO | Attending: Orthopedic Surgery | Admitting: Physical Therapy

## 2021-07-23 ENCOUNTER — Other Ambulatory Visit: Payer: Self-pay

## 2021-07-23 ENCOUNTER — Encounter (HOSPITAL_BASED_OUTPATIENT_CLINIC_OR_DEPARTMENT_OTHER): Payer: Self-pay | Admitting: Physical Therapy

## 2021-07-23 DIAGNOSIS — M25511 Pain in right shoulder: Secondary | ICD-10-CM | POA: Diagnosis not present

## 2021-07-23 DIAGNOSIS — M6281 Muscle weakness (generalized): Secondary | ICD-10-CM | POA: Insufficient documentation

## 2021-07-23 DIAGNOSIS — M25611 Stiffness of right shoulder, not elsewhere classified: Secondary | ICD-10-CM | POA: Diagnosis not present

## 2021-07-24 ENCOUNTER — Encounter (HOSPITAL_BASED_OUTPATIENT_CLINIC_OR_DEPARTMENT_OTHER): Payer: Self-pay | Admitting: Physical Therapy

## 2021-07-24 NOTE — Therapy (Signed)
[facility] [address] [place], [place], [address] Phone: [phone]   Fax:  [phone]  Physical Therapy Treatment  Patient Details  Name: Caitlin Martinez MRN: [id number] Date of Birth: 1971/05/05 Referring Provider (PT): Cammy Copa MD   Encounter Date: 07/23/2021   PT End of Session - 07/23/21 1336     Visit Number 14    Number of Visits 16    Date for PT Re-Evaluation 07/24/21    Authorization Type BCBS 2022 52 visits    PT Start Time 0105    PT Stop Time 0143    PT Time Calculation (min) 38 min    Activity Tolerance Patient tolerated treatment well    Behavior During Therapy [facility] for tasks assessed/performed             Past Medical History:  Diagnosis Date   Migraines     Past Surgical History:  Procedure Laterality Date   CHOLECYSTECTOMY     PLACEMENT OF BREAST IMPLANTS      There were no vitals filed for this visit.   Subjective Assessment - 07/23/21 1558     Subjective Patient went back to work and she drove to [place] and back.    Pertinent History 3 weeks out of surgery    Limitations Lifting;House hold activities                               [facility] Adult PT Treatment/Exercise - 07/24/21 0001       Shoulder Exercises: Supine   Other Supine Exercises AAROM FLEXION WITH CANE 3X10; 3lb cane    Other Supine Exercises active flexion 2x10 1lb ; supine ABC 2x10 1lb supine scpatiuon 1lb      Shoulder Exercises: Sidelying   Other Sidelying Exercises SLER to neutral 3x10 1lb      Shoulder Exercises: Standing   External Rotation 15 reps   2x15 red   Theraband Level (Shoulder Internal Rotation) Level 2 (Red)    Internal Rotation Limitations 3x10    Extension Limitations to neutral blue band 3x10;    Row Limitations 3x10 blue    Other Standing Exercises wall wash 3x10    Other Standing Exercises finger ladder 6x      Shoulder Exercises: Pulleys    Flexion Limitations 2 min    Scaption Limitations 2 min      Manual Therapy   Manual Therapy Joint mobilization    Soft tissue mobilization to upper trap                     PT Education - 07/24/21 1023     Education Details HEP and sympto management    Person(s) Educated Patient    Methods Explanation;Demonstration;Verbal cues;Tactile cues    Comprehension Verbalized understanding;Returned demonstration;Verbal cues required;Tactile cues required              PT Short Term Goals - 07/05/21 1002       PT SHORT TERM GOAL #1   Title Patient will increase passive ER to 75 degrees    Baseline full passive per visual inspection    Time 3    Period Weeks    Status On-going    Target Date 06/19/21      PT SHORT TERM GOAL #2   Title Patient will incrrease passive shoulder flexion to 120 degrees    Baseline 140    Time 3  Period Weeks    Status Achieved    Target Date 06/19/21      PT SHORT TERM GOAL #3   Title Patient will be indepdnent with a base scpaular strengthening and isomtric program    Time 3    Period Weeks    Status Achieved    Target Date 06/19/21               PT Long Term Goals - 05/29/21 1044       PT LONG TERM GOAL #1   Title Patient will reach overhead to a shelf without increased pain    Time 8    Period Weeks    Status New    Target Date 07/24/21      PT LONG TERM GOAL #2   Title Patient will reach behind her head to do her hair without pain    Time 8    Period Weeks    Status New    Target Date 07/24/21      PT LONG TERM GOAL #3   Title Patient will reach behind her back to L4 without pain in order tp perfrom self care tasks    Time 8    Period Weeks    Status New    Target Date 07/24/21                   Plan - 07/24/21 1024     Clinical Impression Statement Exercises were kept consitent 2nd to baseline soreness from her weekend. She did very well with treatmment despite soreness. Therapy advanced her  cabinet reach to the second cabinent. We will re-assess next week. She is back to work. Therapy sugeested cutting down to 1x a week for the next month. We will re-assess game plan next visit.    Personal Factors and Comorbidities Comorbidity 1    Comorbidities migranes    Examination-Activity Limitations Dressing    Examination-Participation Restrictions Community Activity;Laundry;Yard Work;Volunteer;Cleaning    Stability/Clinical Decision Making Stable/Uncomplicated    Clinical Decision Making Low    Rehab Potential Excellent    PT Frequency 2x / week    PT Duration 8 weeks    PT Treatment/Interventions ADLs/Self Care Home Management;Aquatic Therapy;Cryotherapy;Moist Heat;Functional mobility training;Therapeutic activities;Therapeutic exercise;Neuromuscular re-education;Patient/family education;Scar mobilization;Passive range of motion;Energy conservation;Dry needling;Splinting;Taping;Vasopneumatic Device;Joint Manipulations    PT Next Visit Plan Continue with improving range of motion in shoulder flexion and shoulder external rotation. Progress to AAROM at 4 weeks. Propress to Isomtrics and scpaular strengthening at 5-6 weeks. Follow SOS protocol. Moidalities PRN. Advised to ice at home after PT sessions.    PT Home Exercise Plan Cane assisted external rotation, traps shrug relax, pendulum  [id number] see updated HEP    Consulted and Agree with Plan of Care Patient             Patient will benefit from skilled therapeutic intervention in order to improve the following deficits and impairments:  Decreased endurance, Decreased range of motion, Decreased safety awareness, Increased fascial restricitons, Pain, Increased muscle spasms, Impaired UE functional use, Decreased strength  Visit Diagnosis: Stiffness of right shoulder, not elsewhere classified  Acute pain of right shoulder  Muscle weakness (generalized)     Problem List Patient Active Problem List   Diagnosis Date Noted    DEPRESSION 12/09/2007   MIGRAINE, CHRONIC 12/09/2007   ESOPHAGITIS 12/09/2007   GERD 12/09/2007   PEPTIC ULCER DISEASE 12/09/2007   DYSPEPSIA 12/09/2007   DIARRHEA 12/09/2007   CHOLELITHIASIS, HX OF 12/09/2007  [name], PT DPT  07/24/2021, 10:27 AM  [facility] [address] [place], [place], [address] Phone: [phone]   Fax:  [phone]  Name: MARLISHA VANWYK MRN: [id number] Date of Birth: 04-12-1971

## 2021-07-25 ENCOUNTER — Ambulatory Visit (HOSPITAL_BASED_OUTPATIENT_CLINIC_OR_DEPARTMENT_OTHER): Payer: BC Managed Care – PPO | Admitting: Physical Therapy

## 2021-07-25 ENCOUNTER — Encounter (HOSPITAL_BASED_OUTPATIENT_CLINIC_OR_DEPARTMENT_OTHER): Payer: Self-pay | Admitting: Physical Therapy

## 2021-07-25 ENCOUNTER — Other Ambulatory Visit: Payer: Self-pay

## 2021-07-25 DIAGNOSIS — M6281 Muscle weakness (generalized): Secondary | ICD-10-CM | POA: Diagnosis not present

## 2021-07-25 DIAGNOSIS — M25511 Pain in right shoulder: Secondary | ICD-10-CM

## 2021-07-25 DIAGNOSIS — M25611 Stiffness of right shoulder, not elsewhere classified: Secondary | ICD-10-CM

## 2021-07-25 NOTE — Therapy (Signed)
[facility] [address] [place], [place], [address] Phone: [phone]   Fax:  [phone]  Physical Therapy Treatment  Patient Details  Name: Caitlin Martinez MRN: [id number] Date of Birth: 1971-04-29 Referring Provider (PT): Cammy Copa MD   Encounter Date: 07/25/2021   PT End of Session - 07/25/21 0828     Visit Number 15    Number of Visits 27    Date for PT Re-Evaluation 09/05/21    Authorization Type BCBS 2022 52 visits    PT Start Time 0800    PT Stop Time 0843    PT Time Calculation (min) 43 min    Activity Tolerance Patient tolerated treatment well    Behavior During Therapy [facility] for tasks assessed/performed             Past Medical History:  Diagnosis Date   Migraines     Past Surgical History:  Procedure Laterality Date   CHOLECYSTECTOMY     PLACEMENT OF BREAST IMPLANTS      There were no vitals filed for this visit.   Subjective Assessment - 07/25/21 0805     Subjective Patient has no complaints. Overall she feels like she is doing well but it is weak compared to the other arm. She is still getting used to using her arm at the computer for work. 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    Patient is accompained by: Family member    Pertinent History 3 weeks out of surgery    Limitations Lifting;House hold activities    How long can you sit comfortably? N/A    How long can you stand comfortably? N/A    How long can you walk comfortably? N/A    Diagnostic tests nothing post-op    Patient Stated Goals to return to active lifestyle without restrictions    Currently in Pain? Yes    Pain Score 4     Pain Location Shoulder    Pain Orientation Right    Pain Descriptors / Indicators Aching    Pain Type Chronic pain    Pain Onset 1 to 4 weeks ago    Pain Frequency Intermittent    Aggravating Factors  use of the arm    Pain Relieving Factors medication difficulty perfroming ADL's     Effect of Pain on Daily Activities difficulty perfroming ADL's    Multiple Pain Sites No                [facility] PT Assessment - 07/25/21 0001       Assessment   Medical Diagnosis right RTC repair and Bicpes Tenodesis    Referring Provider (PT) Cammy Copa MD      PROM   Right Shoulder Flexion 154 Degrees    Right Shoulder Internal Rotation 70 Degrees    Right Shoulder External Rotation 65 Degrees      Strength   Right/Left Shoulder Left    Right Shoulder Flexion --   10.8 peak   Right Shoulder Internal Rotation --   15.6 peak   Right Shoulder External Rotation --   20.1 peak   Left Shoulder Flexion --   19.3 peak   Left Shoulder Internal Rotation --   16.5 peak   Left Shoulder External Rotation --   15.6 peak     Palpation   Palpation comment spasming in the upper trap  [facility] Adult PT Treatment/Exercise - 07/25/21 0001       Shoulder Exercises: Supine   Other Supine Exercises AAROM FLEXION WITH CANE 3X10; 3lb cane    Other Supine Exercises active flexion 2x10 1lb ; supine ABC 2x10 1lb supine scpatiuon 1lb      Shoulder Exercises: Sidelying   Other Sidelying Exercises SLER to neutral 3x10 1lb      Shoulder Exercises: Standing   External Rotation 15 reps   2x15 red   Theraband Level (Shoulder Internal Rotation) Level 2 (Red)    Internal Rotation Limitations 3x10    Extension Limitations to neutral blue band 3x10;    Row Limitations 3x10 blue    Other Standing Exercises wall wash x15 each flex and cicles    Other Standing Exercises finger ladder 6x      Shoulder Exercises: Pulleys   Flexion Limitations 2 min    Scaption Limitations 2 min      Manual Therapy   Manual Therapy Joint mobilization    Soft tissue mobilization to upper trap                     PT Education - 07/25/21 0807     Education Details reviewed results of testing and progress    Person(s) Educated Patient    Methods  Explanation;Demonstration;Tactile cues;Verbal cues    Comprehension Returned demonstration;Verbal cues required;Verbalized understanding;Tactile cues required              PT Short Term Goals - 07/05/21 1002       PT SHORT TERM GOAL #1   Title Patient will increase passive ER to 75 degrees    Baseline full passive per visual inspection    Time 3    Period Weeks    Status On-going    Target Date 06/19/21      PT SHORT TERM GOAL #2   Title Patient will incrrease passive shoulder flexion to 120 degrees    Baseline 140    Time 3    Period Weeks    Status Achieved    Target Date 06/19/21      PT SHORT TERM GOAL #3   Title Patient will be indepdnent with a base scpaular strengthening and isomtric program    Time 3    Period Weeks    Status Achieved    Target Date 06/19/21               PT Long Term Goals - 05/29/21 1044       PT LONG TERM GOAL #1   Title Patient will reach overhead to a shelf without increased pain    Time 8    Period Weeks    Status New    Target Date 07/24/21      PT LONG TERM GOAL #2   Title Patient will reach behind her head to do her hair without pain    Time 8    Period Weeks    Status New    Target Date 07/24/21      PT LONG TERM GOAL #3   Title Patient will reach behind her back to L4 without pain in order tp perfrom self care tasks    Time 8    Period Weeks    Status New    Target Date 07/24/21                   Plan - 07/25/21 1424     Clinical Impression  Statement The patient has made good progress. Her passive shoulder range is full. Her active flexion is 154 with minor pain. Her shoulder flexion strength was mesured at 55% of the left and ER 75% of the left using hand dyno. She has mild pain with function. She has pain when she does too much. She still requires some cuing to make sure she is not doing too much with the arm like yard work. She continues to have a significant spasm in her right upper trpa. She may  benefit from needling, but will require surgical clearance. She tolerated treatment well. We will continue to progress as tolerated.    Personal Factors and Comorbidities Comorbidity 1    Comorbidities migranes    Examination-Activity Limitations Dressing    Examination-Participation Restrictions Community Activity;Laundry;Yard Work;Volunteer;Cleaning    Stability/Clinical Decision Making Stable/Uncomplicated    Clinical Decision Making Low    Rehab Potential Excellent    PT Frequency 2x / week    PT Duration 8 weeks    PT Treatment/Interventions ADLs/Self Care Home Management;Aquatic Therapy;Cryotherapy;Moist Heat;Functional mobility training;Therapeutic activities;Therapeutic exercise;Neuromuscular re-education;Patient/family education;Scar mobilization;Passive range of motion;Energy conservation;Dry needling;Splinting;Taping;Vasopneumatic Device;Joint Manipulations    PT Next Visit Plan Continue with improving range of motion in shoulder flexion and shoulder external rotation. Progress to AAROM at 4 weeks. Propress to Isomtrics and scpaular strengthening at 5-6 weeks. Follow SOS protocol. Moidalities PRN. Advised to ice at home after PT sessions.    PT Home Exercise Plan Cane assisted external rotation, traps shrug relax, pendulum  [id number] see updated HEP    Consulted and Agree with Plan of Care Patient             Patient will benefit from skilled therapeutic intervention in order to improve the following deficits and impairments:  Decreased endurance, Decreased range of motion, Decreased safety awareness, Increased fascial restricitons, Pain, Increased muscle spasms, Impaired UE functional use, Decreased strength  Visit Diagnosis: Stiffness of right shoulder, not elsewhere classified  Acute pain of right shoulder  Muscle weakness (generalized)     Problem List Patient Active Problem List   Diagnosis Date Noted   DEPRESSION 12/09/2007   MIGRAINE, CHRONIC 12/09/2007    ESOPHAGITIS 12/09/2007   GERD 12/09/2007   PEPTIC ULCER DISEASE 12/09/2007   DYSPEPSIA 12/09/2007   DIARRHEA 12/09/2007   CHOLELITHIASIS, HX OF 12/09/2007    [name], PT 07/25/2021, 2:28 PM  [facility] [address] [place], [place], [address] Phone: [phone]   Fax:  [phone]  Name: Caitlin Martinez MRN: [id number] Date of Birth: 07/22/1971

## 2021-07-30 ENCOUNTER — Other Ambulatory Visit: Payer: Self-pay

## 2021-07-30 ENCOUNTER — Encounter (HOSPITAL_BASED_OUTPATIENT_CLINIC_OR_DEPARTMENT_OTHER): Payer: Self-pay | Admitting: Physical Therapy

## 2021-07-30 ENCOUNTER — Ambulatory Visit (HOSPITAL_BASED_OUTPATIENT_CLINIC_OR_DEPARTMENT_OTHER): Payer: BC Managed Care – PPO | Admitting: Physical Therapy

## 2021-07-30 DIAGNOSIS — M6281 Muscle weakness (generalized): Secondary | ICD-10-CM | POA: Diagnosis not present

## 2021-07-30 DIAGNOSIS — M25511 Pain in right shoulder: Secondary | ICD-10-CM | POA: Diagnosis not present

## 2021-07-30 DIAGNOSIS — M25611 Stiffness of right shoulder, not elsewhere classified: Secondary | ICD-10-CM | POA: Diagnosis not present

## 2021-07-30 NOTE — Therapy (Signed)
[facility] [address] [place], [place], [address] Phone: [phone]   Fax:  [phone]  Physical Therapy Treatment  Patient Details  Name: Caitlin Martinez MRN: [id number] Date of Birth: August 08, 1971 Referring Provider (PT): Cammy Copa MD   Encounter Date: 07/30/2021   PT End of Session - 07/30/21 1534     Visit Number 16    Number of Visits 27    Date for PT Re-Evaluation 09/05/21    Authorization Type BCBS 2022 52 visits    PT Start Time 1345    PT Stop Time 1427    PT Time Calculation (min) 42 min    Activity Tolerance Patient tolerated treatment well    Behavior During Therapy [facility] for tasks assessed/performed             Past Medical History:  Diagnosis Date   Migraines     Past Surgical History:  Procedure Laterality Date   CHOLECYSTECTOMY     PLACEMENT OF BREAST IMPLANTS      There were no vitals filed for this visit.   Subjective Assessment - 07/30/21 1417     Subjective Patient was a little sore after cleaning her bathroom, but it improved on Sunday. She just has a minor ache in her anterior shoulder.    Patient is accompained by: Family member    Limitations Lifting;House hold activities    How long can you sit comfortably? N/A    How long can you stand comfortably? N/A    How long can you walk comfortably? N/A    Diagnostic tests nothing post-op    Patient Stated Goals to return to active lifestyle without restrictions    Currently in Pain? Yes    Pain Score 2     Pain Location Shoulder    Pain Orientation Right    Pain Descriptors / Indicators Aching    Pain Type Chronic pain    Pain Onset 1 to 4 weeks ago    Pain Frequency Intermittent    Aggravating Factors  use of the arm    Pain Relieving Factors rest    Effect of Pain on Daily Activities pain using her dominant arm                               [facility] Adult PT Treatment/Exercise -  07/30/21 0001       Shoulder Exercises: Supine   Other Supine Exercises AAROM FLEXION WITH CANE 3X10; 3lb cane    Other Supine Exercises active flexion 2x10 1lb ; supine ABC 2x10 1lb supine scpatiuon 1lb      Shoulder Exercises: Sidelying   Other Sidelying Exercises SLER to neutral 3x10 1lb      Shoulder Exercises: Standing   External Rotation 15 reps   2x15 red   Theraband Level (Shoulder External Rotation) Level 3 (Green)    Theraband Level (Shoulder Internal Rotation) Level 3 (Green)    Internal Rotation Limitations 2x15    Extension Limitations to neutral blue band 3x10;    Row Limitations 3x10 blue    Other Standing Exercises wall wash x15 each flex and cicles    Other Standing Exercises finger ladder 6x      Shoulder Exercises: Pulleys   Flexion Limitations 2 min    Scaption Limitations 2 min      Shoulder Exercises: Power Soil scientist Exercises scap retraction x20 lbs 2x15; shoulder  extension 2x15 15 lbs      Manual Therapy   Manual Therapy Joint mobilization    Soft tissue mobilization to upper trap                     PT Education - 07/30/21 1533     Education Details reviewed HEP    Person(s) Educated Patient    Methods Explanation;Demonstration;Tactile cues;Verbal cues    Comprehension Verbalized understanding;Returned demonstration;Verbal cues required;Tactile cues required              PT Short Term Goals - 07/05/21 1002       PT SHORT TERM GOAL #1   Title Patient will increase passive ER to 75 degrees    Baseline full passive per visual inspection    Time 3    Period Weeks    Status On-going    Target Date 06/19/21      PT SHORT TERM GOAL #2   Title Patient will incrrease passive shoulder flexion to 120 degrees    Baseline 140    Time 3    Period Weeks    Status Achieved    Target Date 06/19/21      PT SHORT TERM GOAL #3   Title Patient will be indepdnent with a base scpaular strengthening and isomtric program     Time 3    Period Weeks    Status Achieved    Target Date 06/19/21               PT Long Term Goals - 05/29/21 1044       PT LONG TERM GOAL #1   Title Patient will reach overhead to a shelf without increased pain    Time 8    Period Weeks    Status New    Target Date 07/24/21      PT LONG TERM GOAL #2   Title Patient will reach behind her head to do her hair without pain    Time 8    Period Weeks    Status New    Target Date 07/24/21      PT LONG TERM GOAL #3   Title Patient will reach behind her back to L4 without pain in order tp perfrom self care tasks    Time 8    Period Weeks    Status New    Target Date 07/24/21                   Plan - 07/30/21 1534     Clinical Impression Statement The patient continues to make good progress. she perfromed standing I's and Y's in the mirror. She reported fatigue but had no hiking of the shoulder. She had no significant increase in pain. She was able to do scapular exercises in the gym.    Personal Factors and Comorbidities Comorbidity 1    Comorbidities migranes    Examination-Activity Limitations Dressing    Examination-Participation Restrictions Community Activity;Laundry;Yard Work;Volunteer;Cleaning    Stability/Clinical Decision Making Stable/Uncomplicated    Clinical Decision Making Low    Rehab Potential Excellent    PT Frequency 2x / week    PT Duration 8 weeks    PT Treatment/Interventions ADLs/Self Care Home Management;Aquatic Therapy;Cryotherapy;Moist Heat;Functional mobility training;Therapeutic activities;Therapeutic exercise;Neuromuscular re-education;Patient/family education;Scar mobilization;Passive range of motion;Energy conservation;Dry needling;Splinting;Taping;Vasopneumatic Device;Joint Manipulations    PT Next Visit Plan Continue with improving range of motion in shoulder flexion and shoulder external rotation. Progress to AAROM at 4 weeks. Propress  to Isomtrics and scpaular strengthening at 5-6  weeks. Follow SOS protocol. Moidalities PRN. Advised to ice at home after PT sessions.    PT Home Exercise Plan Cane assisted external rotation, traps shrug relax, pendulum  [id number] see updated HEP    Consulted and Agree with Plan of Care Patient             Patient will benefit from skilled therapeutic intervention in order to improve the following deficits and impairments:  Decreased endurance, Decreased range of motion, Decreased safety awareness, Increased fascial restricitons, Pain, Increased muscle spasms, Impaired UE functional use, Decreased strength  Visit Diagnosis: Stiffness of right shoulder, not elsewhere classified  Acute pain of right shoulder  Muscle weakness (generalized)     Problem List Patient Active Problem List   Diagnosis Date Noted   DEPRESSION 12/09/2007   MIGRAINE, CHRONIC 12/09/2007   ESOPHAGITIS 12/09/2007   GERD 12/09/2007   PEPTIC ULCER DISEASE 12/09/2007   DYSPEPSIA 12/09/2007   DIARRHEA 12/09/2007   CHOLELITHIASIS, HX OF 12/09/2007    [name], PT 07/30/2021, 3:48 PM  [facility] [address] [place], [place], [address] Phone: [phone]   Fax:  [phone]  Name: Caitlin Martinez MRN: [id number] Date of Birth: 1971/03/01

## 2021-07-31 ENCOUNTER — Ambulatory Visit (INDEPENDENT_AMBULATORY_CARE_PROVIDER_SITE_OTHER): Payer: BC Managed Care – PPO | Admitting: Orthopedic Surgery

## 2021-07-31 ENCOUNTER — Encounter: Payer: Self-pay | Admitting: Orthopedic Surgery

## 2021-07-31 DIAGNOSIS — M75101 Unspecified rotator cuff tear or rupture of right shoulder, not specified as traumatic: Secondary | ICD-10-CM

## 2021-07-31 NOTE — Progress Notes (Signed)
Post-Op Visit Note   Patient: Caitlin Martinez           Date of Birth: 09/01/1971           MRN: [id number] Visit Date: 07/31/2021 PCP: Catha Gosselin, MD   Assessment & Plan:  Chief Complaint:  Chief Complaint  Patient presents with   Right Shoulder - Pain   Visit Diagnoses:  1. Tear of right supraspinatus tendon     Plan: Patient is a 50 year old female who presents s/p right shoulder rotator cuff repair on 05/06/2021.  She reports continued improvement of her symptoms.  She is in physical therapy 2 times per week until the end of this month and then determination will be made as to whether she continues with formal therapy.  She is also doing home exercise program on the off days.  Taking Tylenol and ibuprofen as needed for pain control.  She does note occasional numbness around the incision site.  She cannot sleep on her right side yet.  She has gone back to work last week where she mostly works remotely except for 2 days out of the week.  She does not have to do any heavy lifting for her job.    On exam she has well-healed incision from prior surgery.  Axillary nerve intact with deltoid firing.  50 degrees external rotation, 100 degrees abduction, 160 degrees forward flexion.  She has excellent rotator cuff strength of supraspinatus and subscapularis rated 5/5.  Very minimal weakness of external rotation rated 5 -/5 compared with contralateral side.  There is no grinding or crepitus noted with passive motion of the shoulder.  She has full active range of motion compared with passive range of motion.  Range of motion has improved compared to last office visit.  Plan for her to follow-up with the office as needed.  Cautioned her against lifting out away from her body and lifting overhead for heavy objects.  Also discussed returning to handstands and cart wheels which is not advisable for this shoulder and she states that she will weigh the risks associated with possible  recurrent damage to the rotator cuff versus how much she wants to be able to continue with her gymnastic maneuvers..  She understands that continuing with these may result in reinjury to the shoulder and require revision surgery.  Follow-up with the office as needed.  Follow-Up Instructions: No follow-ups on file.   Orders:  No orders of the defined types were placed in this encounter.  No orders of the defined types were placed in this encounter.   Imaging: No results found.  PMFS History: Patient Active Problem List   Diagnosis Date Noted   DEPRESSION 12/09/2007   MIGRAINE, CHRONIC 12/09/2007   ESOPHAGITIS 12/09/2007   GERD 12/09/2007   PEPTIC ULCER DISEASE 12/09/2007   DYSPEPSIA 12/09/2007   DIARRHEA 12/09/2007   CHOLELITHIASIS, HX OF 12/09/2007   Past Medical History:  Diagnosis Date   Migraines     Family History  Problem Relation Age of Onset   Hypertension Mother    Diabetes Father    Diabetes Brother     Past Surgical History:  Procedure Laterality Date   CHOLECYSTECTOMY     PLACEMENT OF BREAST IMPLANTS     Social History   Occupational History   Not on file  Tobacco Use   Smoking status: Never   Smokeless tobacco: Never  Substance and Sexual Activity   Alcohol use: Yes   Drug use: No  Sexual activity: Not on file

## 2021-08-01 ENCOUNTER — Encounter (HOSPITAL_BASED_OUTPATIENT_CLINIC_OR_DEPARTMENT_OTHER): Payer: Self-pay | Admitting: Physical Therapy

## 2021-08-01 ENCOUNTER — Other Ambulatory Visit: Payer: Self-pay

## 2021-08-01 ENCOUNTER — Ambulatory Visit (HOSPITAL_BASED_OUTPATIENT_CLINIC_OR_DEPARTMENT_OTHER): Payer: BC Managed Care – PPO | Admitting: Physical Therapy

## 2021-08-01 DIAGNOSIS — M25511 Pain in right shoulder: Secondary | ICD-10-CM

## 2021-08-01 DIAGNOSIS — M25611 Stiffness of right shoulder, not elsewhere classified: Secondary | ICD-10-CM | POA: Diagnosis not present

## 2021-08-01 DIAGNOSIS — M6281 Muscle weakness (generalized): Secondary | ICD-10-CM | POA: Diagnosis not present

## 2021-08-01 NOTE — Therapy (Signed)
[facility] [address] [place], [place], [address] Phone: [phone]   Fax:  [phone]  Physical Therapy Treatment  Patient Details  Name: Caitlin Martinez MRN: [id number] Date of Birth: 11/22/1970 Referring Provider (PT): Cammy Copa MD   Encounter Date: 08/01/2021   PT End of Session - 08/01/21 0830     Visit Number 17    Number of Visits 27    Date for PT Re-Evaluation 09/05/21    Authorization Type BCBS 2022 52 visits    PT Start Time 0802    PT Stop Time 0845    PT Time Calculation (min) 43 min    Activity Tolerance Patient tolerated treatment well    Behavior During Therapy [facility] for tasks assessed/performed             Past Medical History:  Diagnosis Date   Migraines     Past Surgical History:  Procedure Laterality Date   CHOLECYSTECTOMY     PLACEMENT OF BREAST IMPLANTS      There were no vitals filed for this visit.   Subjective Assessment - 08/01/21 0827     Subjective Patient has been to te MD. She has been cleared byt the MD forneedling. She has only has to go back to the MD if neeed. She has only mild soreness at times    Patient is accompained by: Family member    Pertinent History 3 weeks out of surgery    Limitations Lifting;House hold activities    How long can you sit comfortably? N/A    How long can you stand comfortably? N/A    How long can you walk comfortably? N/A    Diagnostic tests nothing post-op    Currently in Pain? No/denies                               [facility] Adult PT Treatment/Exercise - 08/01/21 0001       Shoulder Exercises: Supine   Other Supine Exercises AAROM FLEXION WITH CANE 3X10; 3lb cane    Other Supine Exercises active flexion 2x10 1lb ; supine ABC 2x10 1lb supine scpatiuon 1lb      Shoulder Exercises: Sidelying   Other Sidelying Exercises SLER to neutral 3x10 1lb      Shoulder Exercises: Standing   External  Rotation 15 reps   2x15 red   Theraband Level (Shoulder External Rotation) Level 3 (Green)    Theraband Level (Shoulder Internal Rotation) Level 3 (Green)    Internal Rotation Limitations 2x15    Extension Limitations to neutral blue band 3x10;    Row Limitations 3x10 blue      Shoulder Exercises: Pulleys   Flexion Limitations 2 min    Scaption Limitations 2 min      Shoulder Exercises: Power Soil scientist Exercises scap retraction x20 lbs 2x15; shoulder extension 2x15 20 lbs      Manual Therapy   Manual Therapy Joint mobilization    Manual therapy comments skilled palpation of trigger points    Soft tissue mobilization to upper trap              Trigger Point Dry Needling - 08/01/21 0001     Consent Given? Yes    Education Handout Provided Yes    Muscles Treated Head and Neck Upper trapezius    Dry Needling Comments .30x50    Upper Trapezius Response Palpable increased muscle  length;Twitch reponse elicited                   PT Education - 08/01/21 0829     Education Details reviewed benefits and risk of TPDN    Person(s) Educated Patient    Methods Explanation;Tactile cues;Demonstration;Verbal cues    Comprehension Verbalized understanding;Returned demonstration;Verbal cues required;Tactile cues required              PT Short Term Goals - 07/05/21 1002       PT SHORT TERM GOAL #1   Title Patient will increase passive ER to 75 degrees    Baseline full passive per visual inspection    Time 3    Period Weeks    Status On-going    Target Date 06/19/21      PT SHORT TERM GOAL #2   Title Patient will incrrease passive shoulder flexion to 120 degrees    Baseline 140    Time 3    Period Weeks    Status Achieved    Target Date 06/19/21      PT SHORT TERM GOAL #3   Title Patient will be indepdnent with a base scpaular strengthening and isomtric program    Time 3    Period Weeks    Status Achieved    Target Date 06/19/21                PT Long Term Goals - 05/29/21 1044       PT LONG TERM GOAL #1   Title Patient will reach overhead to a shelf without increased pain    Time 8    Period Weeks    Status New    Target Date 07/24/21      PT LONG TERM GOAL #2   Title Patient will reach behind her head to do her hair without pain    Time 8    Period Weeks    Status New    Target Date 07/24/21      PT LONG TERM GOAL #3   Title Patient will reach behind her back to L4 without pain in order tp perfrom self care tasks    Time 8    Period Weeks    Status New    Target Date 07/24/21                   Plan - 08/01/21 0830     Clinical Impression Statement Patient tolerated needling well. She had a great twitch in her upper trap. She continues to tolerate exercises well. Therapy will continue to progress as toleraed.    Personal Factors and Comorbidities Comorbidity 1    Comorbidities migranes    Examination-Activity Limitations Dressing    Examination-Participation Restrictions Community Activity;Laundry;Yard Work;Volunteer;Cleaning    Stability/Clinical Decision Making Stable/Uncomplicated    Clinical Decision Making Low    Rehab Potential Excellent    PT Frequency 2x / week    PT Duration 8 weeks    PT Treatment/Interventions ADLs/Self Care Home Management;Aquatic Therapy;Cryotherapy;Moist Heat;Functional mobility training;Therapeutic activities;Therapeutic exercise;Neuromuscular re-education;Patient/family education;Scar mobilization;Passive range of motion;Energy conservation;Dry needling;Splinting;Taping;Vasopneumatic Device;Joint Manipulations    PT Next Visit Plan Continue with improving range of motion in shoulder flexion and shoulder external rotation. Progress to AAROM at 4 weeks. Propress to Isomtrics and scpaular strengthening at 5-6 weeks. Follow SOS protocol. Moidalities PRN. Advised to ice at home after PT sessions.    PT Home Exercise Plan Cane assisted external rotation, traps shrug  relax, pendulum  [id number] see updated HEP    Consulted and Agree with Plan of Care Patient             Patient will benefit from skilled therapeutic intervention in order to improve the following deficits and impairments:  Decreased endurance, Decreased range of motion, Decreased safety awareness, Increased fascial restricitons, Pain, Increased muscle spasms, Impaired UE functional use, Decreased strength  Visit Diagnosis: Stiffness of right shoulder, not elsewhere classified  Acute pain of right shoulder  Muscle weakness (generalized)     Problem List Patient Active Problem List   Diagnosis Date Noted   DEPRESSION 12/09/2007   MIGRAINE, CHRONIC 12/09/2007   ESOPHAGITIS 12/09/2007   GERD 12/09/2007   PEPTIC ULCER DISEASE 12/09/2007   DYSPEPSIA 12/09/2007   DIARRHEA 12/09/2007   CHOLELITHIASIS, HX OF 12/09/2007    [name], PT 08/01/2021, 9:09 AM  [facility] [address] [place], [place], [address] Phone: [phone]   Fax:  [phone]  Name: Caitlin Martinez MRN: [id number] Date of Birth: [birth year]

## 2021-08-06 ENCOUNTER — Ambulatory Visit (HOSPITAL_BASED_OUTPATIENT_CLINIC_OR_DEPARTMENT_OTHER): Payer: BC Managed Care – PPO | Admitting: Physical Therapy

## 2021-08-06 ENCOUNTER — Other Ambulatory Visit: Payer: Self-pay

## 2021-08-06 ENCOUNTER — Encounter (HOSPITAL_BASED_OUTPATIENT_CLINIC_OR_DEPARTMENT_OTHER): Payer: Self-pay | Admitting: Physical Therapy

## 2021-08-06 DIAGNOSIS — M25611 Stiffness of right shoulder, not elsewhere classified: Secondary | ICD-10-CM

## 2021-08-06 DIAGNOSIS — M25511 Pain in right shoulder: Secondary | ICD-10-CM

## 2021-08-06 DIAGNOSIS — M6281 Muscle weakness (generalized): Secondary | ICD-10-CM

## 2021-08-06 NOTE — Therapy (Signed)
[facility] [address] [place], [place], [address] Phone: [phone]   Fax:  [phone]  Physical Therapy Treatment  Patient Details  Name: Caitlin Martinez MRN: [id number] Date of Birth: 04-09-1971 Referring Provider (PT): Cammy Copa MD   Encounter Date: 08/06/2021   PT End of Session - 08/06/21 0811     Visit Number 18    Number of Visits 27    Date for PT Re-Evaluation 09/05/21    Authorization Type BCBS 2022 52 visits    PT Start Time 0806   Patient doing paper work with the front.   PT Stop Time 0845    PT Time Calculation (min) 39 min    Activity Tolerance Patient tolerated treatment well    Behavior During Therapy [facility] for tasks assessed/performed             Past Medical History:  Diagnosis Date   Migraines     Past Surgical History:  Procedure Laterality Date   CHOLECYSTECTOMY     PLACEMENT OF BREAST IMPLANTS      There were no vitals filed for this visit.   Subjective Assessment - 08/06/21 0809     Subjective Patient has been to the gym on her own.  The spot in her shoulder was a little sore after needling but she feels like it helped.    Pertinent History 3 weeks out of surgery    Limitations Lifting;House hold activities    How long can you sit comfortably? N/A    How long can you stand comfortably? N/A    How long can you walk comfortably? N/A    Diagnostic tests nothing post-op    Patient Stated Goals to return to active lifestyle without restrictions    Currently in Pain? No/denies                [facility] PT Assessment - 08/06/21 0001       Strength   Right Shoulder Flexion --   15.2                          [facility] Adult PT Treatment/Exercise - 08/06/21 0001       Shoulder Exercises: Supine   Other Supine Exercises AAROM FLEXION WITH CANE 3X10; 3lb cane    Other Supine Exercises active flexion 2x10 2lb ; supine ABC 2x10 3lb supine  scpatiuon 2lb 2x15      Shoulder Exercises: Sidelying   Other Sidelying Exercises SLER to neutral 3x10 1lb      Shoulder Exercises: Standing   External Rotation 15 reps   2x15 red   Theraband Level (Shoulder External Rotation) Level 3 (Green)    Theraband Level (Shoulder Internal Rotation) Level 3 (Green)    Internal Rotation Limitations 2x15    Extension Limitations to neutral blue band 3x10;    Row Limitations 3x10 blue    Other Standing Exercises scaption 2x10 1lb flexion 2x10 1lb      Shoulder Exercises: Pulleys   Flexion Limitations 2 min    Scaption Limitations 2 min      Shoulder Exercises: Power Soil scientist Exercises scap retraction x20 lbs 2x15; shoulder extension 2x15 20 lbs      Manual Therapy   Manual Therapy Joint mobilization    Manual therapy comments skilled palpation of trigger points    Soft tissue mobilization to upper trap  Trigger Point Dry Needling - 08/06/21 0001     Consent Given? Yes    Education Handout Provided Yes    Dry Needling Comments .30x50 needled tow spots withno noticable twtitch                   PT Education - 08/06/21 0810     Education Details HEP and symptom mangement    Person(s) Educated Patient    Methods Explanation;Demonstration;Tactile cues;Verbal cues    Comprehension Verbalized understanding;Returned demonstration;Verbal cues required;Tactile cues required              PT Short Term Goals - 07/05/21 1002       PT SHORT TERM GOAL #1   Title Patient will increase passive ER to 75 degrees    Baseline full passive per visual inspection    Time 3    Period Weeks    Status On-going    Target Date 06/19/21      PT SHORT TERM GOAL #2   Title Patient will incrrease passive shoulder flexion to 120 degrees    Baseline 140    Time 3    Period Weeks    Status Achieved    Target Date 06/19/21      PT SHORT TERM GOAL #3   Title Patient will be indepdnent with a base scpaular  strengthening and isomtric program    Time 3    Period Weeks    Status Achieved    Target Date 06/19/21               PT Long Term Goals - 05/29/21 1044       PT LONG TERM GOAL #1   Title Patient will reach overhead to a shelf without increased pain    Time 8    Period Weeks    Status New    Target Date 07/24/21      PT LONG TERM GOAL #2   Title Patient will reach behind her head to do her hair without pain    Time 8    Period Weeks    Status New    Target Date 07/24/21      PT LONG TERM GOAL #3   Title Patient will reach behind her back to L4 without pain in order tp perfrom self care tasks    Time 8    Period Weeks    Status New    Target Date 07/24/21                   Plan - 08/06/21 0824     Clinical Impression Statement Patient is making great progress. her peak torque with flexion has mproved by 5lbs in 2 weeks. She  did not have any pain with ther-ex. Therapy advanced her ABC's and gave her 1lb flexion. She has returned to the gym. She is progressing towards D/C    Personal Factors and Comorbidities Comorbidity 1    Comorbidities migranes    Examination-Activity Limitations Dressing    Examination-Participation Restrictions Community Activity;Laundry;Yard Work;Volunteer;Cleaning    Stability/Clinical Decision Making Stable/Uncomplicated    Clinical Decision Making Low    Rehab Potential Excellent    PT Frequency 2x / week    PT Duration 8 weeks    PT Treatment/Interventions ADLs/Self Care Home Management;Aquatic Therapy;Cryotherapy;Moist Heat;Functional mobility training;Therapeutic activities;Therapeutic exercise;Neuromuscular re-education;Patient/family education;Scar mobilization;Passive range of motion;Energy conservation;Dry needling;Splinting;Taping;Vasopneumatic Device;Joint Manipulations    PT Next Visit Plan ue to rpogress strengthening as tolerated.  PT Home Exercise Plan Cane assisted external rotation, traps shrug relax, pendulum   [id number] see updated HEP    Consulted and Agree with Plan of Care Patient             Patient will benefit from skilled therapeutic intervention in order to improve the following deficits and impairments:  Decreased endurance, Decreased range of motion, Decreased safety awareness, Increased fascial restricitons, Pain, Increased muscle spasms, Impaired UE functional use, Decreased strength  Visit Diagnosis: Stiffness of right shoulder, not elsewhere classified  Acute pain of right shoulder  Muscle weakness (generalized)     Problem List Patient Active Problem List   Diagnosis Date Noted   DEPRESSION 12/09/2007   MIGRAINE, CHRONIC 12/09/2007   ESOPHAGITIS 12/09/2007   GERD 12/09/2007   PEPTIC ULCER DISEASE 12/09/2007   DYSPEPSIA 12/09/2007   DIARRHEA 12/09/2007   CHOLELITHIASIS, HX OF 12/09/2007    [name], PT 08/06/2021, 3:43 PM  [facility] [address] [place], [place], [address] Phone: [phone]   Fax:  [phone]  Name: Caitlin Martinez MRN: [id number] Date of Birth: [birth year]

## 2021-08-08 ENCOUNTER — Other Ambulatory Visit: Payer: Self-pay

## 2021-08-08 ENCOUNTER — Encounter (HOSPITAL_BASED_OUTPATIENT_CLINIC_OR_DEPARTMENT_OTHER): Payer: Self-pay | Admitting: Physical Therapy

## 2021-08-08 ENCOUNTER — Ambulatory Visit (HOSPITAL_BASED_OUTPATIENT_CLINIC_OR_DEPARTMENT_OTHER): Payer: BC Managed Care – PPO | Admitting: Physical Therapy

## 2021-08-08 DIAGNOSIS — M25511 Pain in right shoulder: Secondary | ICD-10-CM | POA: Diagnosis not present

## 2021-08-08 DIAGNOSIS — M25611 Stiffness of right shoulder, not elsewhere classified: Secondary | ICD-10-CM

## 2021-08-08 DIAGNOSIS — M6281 Muscle weakness (generalized): Secondary | ICD-10-CM

## 2021-08-09 NOTE — Therapy (Signed)
[facility] [address] [place], [place], [address] Phone: [phone]   Fax:  [phone]  Physical Therapy Treatment  Patient Details  Name: Caitlin Martinez MRN: [id number] Date of Birth: 1971/05/07 Referring Provider (PT): Cammy Copa MD   Encounter Date: 08/08/2021   PT End of Session - 08/08/21 0810     Visit Number 19    Number of Visits 27    Date for PT Re-Evaluation 09/05/21    Authorization Type BCBS 2022 52 visits    PT Start Time 0805   5 min late   PT Stop Time 0843    PT Time Calculation (min) 38 min    Activity Tolerance Patient tolerated treatment well    Behavior During Therapy [facility] for tasks assessed/performed             Past Medical History:  Diagnosis Date   Migraines     Past Surgical History:  Procedure Laterality Date   CHOLECYSTECTOMY     PLACEMENT OF BREAST IMPLANTS      There were no vitals filed for this visit.   Subjective Assessment - 08/08/21 0809     Subjective Patient has no complaints today.    Patient is accompained by: Family member    Pertinent History 3 weeks out of surgery    Limitations Lifting;House hold activities    How long can you sit comfortably? N/A    How long can you stand comfortably? N/A    How long can you walk comfortably? N/A    Diagnostic tests nothing post-op    Patient Stated Goals to return to active lifestyle without restrictions    Currently in Pain? No/denies                               [facility] Adult PT Treatment/Exercise - 08/09/21 0001       Shoulder Exercises: Supine   Other Supine Exercises AAROM FLEXION WITH CANE 3X10; 3lb cane    Other Supine Exercises active flexion 2x10 2lb ; supine ABC 2x10 3lb supine scpatiuon 2lb 2x15      Shoulder Exercises: Seated   Other Seated Exercises UBE 2 min fwd and 2 back      Shoulder Exercises: Sidelying   Other Sidelying Exercises SLER to neutral 3x10  1lb      Shoulder Exercises: Standing   External Rotation 15 reps   2x15 red   Theraband Level (Shoulder External Rotation) Level 3 (Green)    Theraband Level (Shoulder Internal Rotation) Level 3 (Green)    Internal Rotation Limitations 2x15    Extension Limitations to neutral blue band 3x10;    Row Limitations 3x10 blue    Other Standing Exercises scaption 2x10 2lb flexion 2x10 2lb      Shoulder Exercises: Power Soil scientist Exercises scap retraction x20 lbs 2x15; shoulder extension 2x15 20 lbs      Manual Therapy   Manual Therapy Joint mobilization    Manual therapy comments skilled palpation of trigger points    Soft tissue mobilization to upper trap                     PT Education - 08/08/21 0809     Education Details HEP and symptom management    Person(s) Educated Patient    Methods Explanation;Demonstration;Tactile cues;Verbal cues    Comprehension Verbalized understanding;Returned demonstration;Verbal cues required;Tactile cues  required              PT Short Term Goals - 07/05/21 1002       PT SHORT TERM GOAL #1   Title Patient will increase passive ER to 75 degrees    Baseline full passive per visual inspection    Time 3    Period Weeks    Status On-going    Target Date 06/19/21      PT SHORT TERM GOAL #2   Title Patient will incrrease passive shoulder flexion to 120 degrees    Baseline 140    Time 3    Period Weeks    Status Achieved    Target Date 06/19/21      PT SHORT TERM GOAL #3   Title Patient will be indepdnent with a base scpaular strengthening and isomtric program    Time 3    Period Weeks    Status Achieved    Target Date 06/19/21               PT Long Term Goals - 05/29/21 1044       PT LONG TERM GOAL #1   Title Patient will reach overhead to a shelf without increased pain    Time 8    Period Weeks    Status New    Target Date 07/24/21      PT LONG TERM GOAL #2   Title Patient will reach behind  her head to do her hair without pain    Time 8    Period Weeks    Status New    Target Date 07/24/21      PT LONG TERM GOAL #3   Title Patient will reach behind her back to L4 without pain in order tp perfrom self care tasks    Time 8    Period Weeks    Status New    Target Date 07/24/21                   Plan - 08/09/21 0962     Clinical Impression Statement Patient continues to tolerate treatment well. Therapy added UBE She is working on exercises on her own in the gy,> She still feels weak in overhead activity but that is imprpving. Therapy will continue to prgoress as tolerated    Personal Factors and Comorbidities Comorbidity 1    Comorbidities migranes    Examination-Activity Limitations Dressing    Examination-Participation Restrictions Community Activity;Laundry;Yard Work;Volunteer;Cleaning    Stability/Clinical Decision Making Stable/Uncomplicated    Clinical Decision Making Low    Rehab Potential Excellent    PT Frequency 2x / week    PT Duration 8 weeks    PT Treatment/Interventions ADLs/Self Care Home Management;Aquatic Therapy;Cryotherapy;Moist Heat;Functional mobility training;Therapeutic activities;Therapeutic exercise;Neuromuscular re-education;Patient/family education;Scar mobilization;Passive range of motion;Energy conservation;Dry needling;Splinting;Taping;Vasopneumatic Device;Joint Manipulations    PT Next Visit Plan ue to rpogress strengthening as tolerated.    PT Home Exercise Plan Cane assisted external rotation, traps shrug relax, pendulum  [id number] see updated HEP    Consulted and Agree with Plan of Care Patient             Patient will benefit from skilled therapeutic intervention in order to improve the following deficits and impairments:  Decreased endurance, Decreased range of motion, Decreased safety awareness, Increased fascial restricitons, Pain, Increased muscle spasms, Impaired UE functional use, Decreased strength  Visit  Diagnosis: Stiffness of right shoulder, not elsewhere classified  Acute pain of right shoulder  Muscle  weakness (generalized)     Problem List Patient Active Problem List   Diagnosis Date Noted   DEPRESSION 12/09/2007   MIGRAINE, CHRONIC 12/09/2007   ESOPHAGITIS 12/09/2007   GERD 12/09/2007   PEPTIC ULCER DISEASE 12/09/2007   DYSPEPSIA 12/09/2007   DIARRHEA 12/09/2007   CHOLELITHIASIS, HX OF 12/09/2007    [name], PT 08/09/2021, 7:21 AM  [facility] [address] [place], [place], [address] Phone: [phone]   Fax:  [phone]  Name: Caitlin Martinez MRN: [id number] Date of Birth: 03-07-1971

## 2021-08-13 ENCOUNTER — Encounter (HOSPITAL_BASED_OUTPATIENT_CLINIC_OR_DEPARTMENT_OTHER): Payer: Self-pay | Admitting: Physical Therapy

## 2021-08-13 ENCOUNTER — Encounter (HOSPITAL_BASED_OUTPATIENT_CLINIC_OR_DEPARTMENT_OTHER): Payer: BC Managed Care – PPO | Attending: Family Medicine | Admitting: Physical Therapy

## 2021-08-13 ENCOUNTER — Other Ambulatory Visit: Payer: Self-pay

## 2021-08-13 DIAGNOSIS — M25511 Pain in right shoulder: Secondary | ICD-10-CM | POA: Diagnosis not present

## 2021-08-13 DIAGNOSIS — M6281 Muscle weakness (generalized): Secondary | ICD-10-CM | POA: Insufficient documentation

## 2021-08-13 DIAGNOSIS — M25611 Stiffness of right shoulder, not elsewhere classified: Secondary | ICD-10-CM | POA: Insufficient documentation

## 2021-08-13 NOTE — Therapy (Signed)
[facility] [address] [place], [place], [address] Phone: [phone]   Fax:  [phone]  Physical Therapy Treatment  Patient Details  Name: Caitlin Martinez MRN: [id number] Date of Birth: [birth year] Referring Provider (PT): Cammy Copa MD   Encounter Date: 08/13/2021   PT End of Session - 08/13/21 1336     Visit Number 20    Number of Visits 27    Date for PT Re-Evaluation 09/05/21    Authorization Type BCBS 2022 52 visits    PT Start Time 0800    PT Stop Time 0839    PT Time Calculation (min) 39 min    Activity Tolerance Patient tolerated treatment well    Behavior During Therapy [facility] for tasks assessed/performed             Past Medical History:  Diagnosis Date   Migraines     Past Surgical History:  Procedure Laterality Date   CHOLECYSTECTOMY     PLACEMENT OF BREAST IMPLANTS      There were no vitals filed for this visit.   Subjective Assessment - 08/13/21 1001     Subjective Patient rean a 5K over the weekend. She felt good.    Patient is accompained by: Family member    Pertinent History 3 weeks out of surgery    Limitations Lifting;House hold activities    How long can you sit comfortably? N/A    How long can you stand comfortably? N/A    How long can you walk comfortably? N/A    Diagnostic tests nothing post-op    Patient Stated Goals to return to active lifestyle without restrictions    Currently in Pain? No/denies                           Shoulder Exercises: Supine     Other Supine Exercises AAROM FLEXION WITH CANE 3X10; 3lb cane     Other Supine Exercises active flexion 2x10 2lb ; supine ABC 2x10 3lb supine scpatiuon 2lb 2x15          Shoulder Exercises: Seated    Other Seated Exercises UBE 2 min fwd and 2 back          Shoulder Exercises: Sidelying    Other Sidelying Exercises SLER to neutral 3x10 2lb          Shoulder Exercises: Standing                                   Other Standing Exercises scaption 2x10 2lb flexion 2x10 2lb          Shoulder Exercises: Power Associate Professor Exercises scap retraction x20 lbs 2x15; shoulder extension 2x15 20 lbs   Shoulder press 3x10 10lbs  Tricpes extension 3x10  High lat pull down machine 3x10 20 lbs               PT Education - 08/13/21 1002     Education Details proper set up and weights in the gym    Person(s) Educated Patient    Methods Explanation;Demonstration;Tactile cues;Verbal cues    Comprehension Verbalized understanding;Returned demonstration;Verbal cues required;Tactile cues required              PT Short Term Goals - 08/13/21 1358       PT SHORT TERM GOAL #1   Title  Patient will increase passive ER to 75 degrees    Baseline full passive per visual inspection    Time 3    Period Weeks    Status On-going    Target Date 06/19/21      PT SHORT TERM GOAL #2   Title Patient will incrrease passive shoulder flexion to 120 degrees    Baseline 140    Time 3    Period Weeks    Status Achieved    Target Date 06/19/21      PT SHORT TERM GOAL #3   Title Patient will be indepdnent with a base scpaular strengthening and isomtric program    Time 3    Period Weeks    Status Achieved    Target Date 06/19/21                      Plan - 08/13/21 1338     Clinical Impression Statement Patient focused more on gym exercises today. She did very well. She is motviated to continue with her work at [employer] Therapy talked to her about sets and reps and progression>S he would like to do low weight high rep toning exercises. Therapy will review her whole program next visit.    Personal Factors and Comorbidities Comorbidity 1    Comorbidities migranes    Examination-Activity Limitations Dressing    Examination-Participation Restrictions Community Activity;Laundry;Yard Work;Volunteer;Cleaning    Stability/Clinical Decision Making  Stable/Uncomplicated    Clinical Decision Making Low    Rehab Potential Excellent    PT Frequency 2x / week    PT Duration 8 weeks    PT Treatment/Interventions ADLs/Self Care Home Management;Aquatic Therapy;Cryotherapy;Moist Heat;Functional mobility training;Therapeutic activities;Therapeutic exercise;Neuromuscular re-education;Patient/family education;Scar mobilization;Passive range of motion;Energy conservation;Dry needling;Splinting;Taping;Vasopneumatic Device;Joint Manipulations    PT Next Visit Plan ue to rpogress strengthening as tolerated.    PT Home Exercise Plan Cane assisted external rotation, traps shrug relax, pendulum  [id number] see updated HEP    Consulted and Agree with Plan of Care Patient             Patient will benefit from skilled therapeutic intervention in order to improve the following deficits and impairments:  Decreased endurance, Decreased range of motion, Decreased safety awareness, Increased fascial restricitons, Pain, Increased muscle spasms, Impaired UE functional use, Decreased strength  Visit Diagnosis: Stiffness of right shoulder, not elsewhere classified  Acute pain of right shoulder  Muscle weakness (generalized)     Problem List Patient Active Problem List   Diagnosis Date Noted   DEPRESSION 12/09/2007   MIGRAINE, CHRONIC 12/09/2007   ESOPHAGITIS 12/09/2007   GERD 12/09/2007   PEPTIC ULCER DISEASE 12/09/2007   DYSPEPSIA 12/09/2007   DIARRHEA 12/09/2007   CHOLELITHIASIS, HX OF 12/09/2007    Caitlin Martinez, PT 08/13/2021, 2:00 PM  [facility] [address] [place], [place], [address] Phone: [phone]   Fax:  [phone]  Name: Caitlin Martinez MRN: [id number] Date of Birth: 1971/07/23

## 2021-08-15 ENCOUNTER — Ambulatory Visit (HOSPITAL_BASED_OUTPATIENT_CLINIC_OR_DEPARTMENT_OTHER): Payer: BC Managed Care – PPO | Admitting: Physical Therapy

## 2021-08-15 ENCOUNTER — Encounter (HOSPITAL_BASED_OUTPATIENT_CLINIC_OR_DEPARTMENT_OTHER): Payer: Self-pay | Admitting: Physical Therapy

## 2021-08-15 ENCOUNTER — Other Ambulatory Visit: Payer: Self-pay

## 2021-08-15 DIAGNOSIS — M25511 Pain in right shoulder: Secondary | ICD-10-CM | POA: Diagnosis not present

## 2021-08-15 DIAGNOSIS — M6281 Muscle weakness (generalized): Secondary | ICD-10-CM

## 2021-08-15 DIAGNOSIS — M25611 Stiffness of right shoulder, not elsewhere classified: Secondary | ICD-10-CM | POA: Diagnosis not present

## 2021-08-15 NOTE — Therapy (Signed)
[facility] [address] [place], [place], [address] Phone: [phone]   Fax:  [phone]  Physical Therapy Treatment  Patient Details  Name: Caitlin Martinez MRN: [id number] Date of Birth: 18-Oct-1971 Referring Provider (PT): Cammy Copa MD   Encounter Date: 08/15/2021   PT End of Session - 08/15/21 0813     Visit Number 21    Number of Visits 27    Date for PT Re-Evaluation 09/05/21    Authorization Type BCBS 2022 52 visits    PT Start Time 0802    PT Stop Time 0842    PT Time Calculation (min) 40 min    Activity Tolerance Patient tolerated treatment well    Behavior During Therapy [facility] for tasks assessed/performed             Past Medical History:  Diagnosis Date   Migraines     Past Surgical History:  Procedure Laterality Date   CHOLECYSTECTOMY     PLACEMENT OF BREAST IMPLANTS      There were no vitals filed for this visit.   Subjective Assessment - 08/15/21 0811     Subjective Patient reports it is a little sore this morning. She is otherwise doing well.    Pertinent History 3 weeks out of surgery    Limitations Lifting;House hold activities    How long can you sit comfortably? N/A    How long can you stand comfortably? N/A    How long can you walk comfortably? N/A    Diagnostic tests nothing post-op    Patient Stated Goals to return to active lifestyle without restrictions    Currently in Pain? Yes    Pain Score 2     Pain Orientation Right    Pain Descriptors / Indicators Aching    Pain Type Chronic pain    Pain Onset 1 to 4 weeks ago    Pain Frequency Intermittent    Aggravating Factors  use of the arm    Pain Relieving Factors rest    Effect of Pain on Daily Activities pain using the dominant arm at times                        Shoulder Exercises: Supine      Other Supine Exercises AAROM FLEXION WITH CANE 3X10; 3lb cane     Other Supine Exercises  active flexion 2x10 2lb ; supine ABC 2x10 3lb supine scpatiuon 2lb 2x15            Shoulder Exercises: Seated    Other Seated Exercises UBE 2 min fwd and 2 back            Shoulder Exercises: Sidelying    Other Sidelying Exercises SLER to neutral 3x10 2lb            Shoulder Exercises: Standing                                              Other Standing Exercises scaption 2x10 2lb flexion 2x10 2lb            Shoulder Exercises: Power Associate Professor Exercises scap retraction x20 lbs 2x15; shoulder extension 2x15 20 lbs   Shoulder press 3x10 10lbs  Tricpes extension 3x10   High lat pull down machine 3x10 20 lbs  Reviewed use of the thera-cane and where to buy for home                 PT Education - 08/15/21 0813     Education Details reviewed HEP for the next few weeks    Person(s) Educated Patient    Methods Explanation;Demonstration;Verbal cues;Tactile cues    Comprehension Verbalized understanding;Returned demonstration;Verbal cues required;Tactile cues required              PT Short Term Goals - 08/13/21 1358       PT SHORT TERM GOAL #1   Title Patient will increase passive ER to 75 degrees    Baseline full passive per visual inspection    Time 3    Period Weeks    Status On-going    Target Date 06/19/21      PT SHORT TERM GOAL #2   Title Patient will incrrease passive shoulder flexion to 120 degrees    Baseline 140    Time 3    Period Weeks    Status Achieved    Target Date 06/19/21      PT SHORT TERM GOAL #3   Title Patient will be indepdnent with a base scpaular strengthening and isomtric program    Time 3    Period Weeks    Status Achieved    Target Date 06/19/21               PT Long Term Goals - 05/29/21 1044       PT LONG TERM GOAL #1   Title Patient will reach overhead to a shelf without increased pain    Time 8    Period Weeks    Status New    Target Date 07/24/21      PT LONG TERM GOAL #2    Title Patient will reach behind her head to do her hair without pain    Time 8    Period Weeks    Status New    Target Date 07/24/21      PT LONG TERM GOAL #3   Title Patient will reach behind her back to L4 without pain in order tp perfrom self care tasks    Time 8    Period Weeks    Status New    Target Date 07/24/21                   Plan - 08/15/21 0815     Clinical Impression Statement Patient has made good progress. She still has some pain and catching with end range ativity. She was advised the total rehab time is about months. She will continue with exercises for the next month, then check back in with PT.    Personal Factors and Comorbidities Comorbidity 1    Comorbidities migranes    Examination-Activity Limitations Dressing    Examination-Participation Restrictions Community Activity;Laundry;Yard Work;Volunteer;Cleaning    Stability/Clinical Decision Making Stable/Uncomplicated    Clinical Decision Making Low    Rehab Potential Excellent    PT Frequency 2x / week    PT Duration 8 weeks    PT Treatment/Interventions ADLs/Self Care Home Management;Aquatic Therapy;Cryotherapy;Moist Heat;Functional mobility training;Therapeutic activities;Therapeutic exercise;Neuromuscular re-education;Patient/family education;Scar mobilization;Passive range of motion;Energy conservation;Dry needling;Splinting;Taping;Vasopneumatic Device;Joint Manipulations    PT Next Visit Plan ue to rpogress strengthening as tolerated.    PT Home Exercise Plan Cane assisted external rotation, traps shrug relax, pendulum  [id number] see updated HEP    Consulted and Agree with Plan of Care  Patient             Patient will benefit from skilled therapeutic intervention in order to improve the following deficits and impairments:  Decreased endurance, Decreased range of motion, Decreased safety awareness, Increased fascial restricitons, Pain, Increased muscle spasms, Impaired UE functional use,  Decreased strength  Visit Diagnosis: Stiffness of right shoulder, not elsewhere classified  Acute pain of right shoulder  Muscle weakness (generalized)     Problem List Patient Active Problem List   Diagnosis Date Noted   DEPRESSION 12/09/2007   MIGRAINE, CHRONIC 12/09/2007   ESOPHAGITIS 12/09/2007   GERD 12/09/2007   PEPTIC ULCER DISEASE 12/09/2007   DYSPEPSIA 12/09/2007   DIARRHEA 12/09/2007   CHOLELITHIASIS, HX OF 12/09/2007    [name], PT 08/15/2021, 8:52 AM  [facility] [address] [place], [place], [address] Phone: [phone]   Fax:  [phone]  Name: Cleota Pellerito MRN: [id number] Date of Birth: 1970/12/25

## 2021-08-20 DIAGNOSIS — J101 Influenza due to other identified influenza virus with other respiratory manifestations: Secondary | ICD-10-CM | POA: Diagnosis not present

## 2021-08-20 DIAGNOSIS — R1110 Vomiting, unspecified: Secondary | ICD-10-CM | POA: Diagnosis not present

## 2021-08-20 DIAGNOSIS — R051 Acute cough: Secondary | ICD-10-CM | POA: Diagnosis not present

## 2021-08-23 DIAGNOSIS — G43909 Migraine, unspecified, not intractable, without status migrainosus: Secondary | ICD-10-CM | POA: Diagnosis not present

## 2021-08-23 DIAGNOSIS — M542 Cervicalgia: Secondary | ICD-10-CM | POA: Diagnosis not present

## 2021-08-31 DIAGNOSIS — R051 Acute cough: Secondary | ICD-10-CM | POA: Diagnosis not present

## 2021-09-18 ENCOUNTER — Ambulatory Visit (HOSPITAL_BASED_OUTPATIENT_CLINIC_OR_DEPARTMENT_OTHER): Payer: BC Managed Care – PPO | Attending: Orthopedic Surgery | Admitting: Physical Therapy

## 2021-09-18 ENCOUNTER — Encounter (HOSPITAL_BASED_OUTPATIENT_CLINIC_OR_DEPARTMENT_OTHER): Payer: Self-pay | Admitting: Physical Therapy

## 2021-09-18 ENCOUNTER — Other Ambulatory Visit: Payer: Self-pay

## 2021-09-18 DIAGNOSIS — M6281 Muscle weakness (generalized): Secondary | ICD-10-CM | POA: Diagnosis not present

## 2021-09-18 DIAGNOSIS — M25611 Stiffness of right shoulder, not elsewhere classified: Secondary | ICD-10-CM | POA: Diagnosis not present

## 2021-09-18 DIAGNOSIS — M25511 Pain in right shoulder: Secondary | ICD-10-CM | POA: Diagnosis not present

## 2021-09-18 NOTE — Therapy (Signed)
[facility] [address] [place], [place], [address] Phone: [phone]   Fax:  [phone]  Physical Therapy Treatment/Discharge   Patient Details  Name: Caitlin Martinez MRN: [id number] Date of Birth: 1970-11-02 Referring Provider (PT): Cammy Copa MD   Encounter Date: 09/18/2021   PT End of Session - 09/18/21 1121     Visit Number 22    Number of Visits 27    Date for PT Re-Evaluation 09/05/21    Authorization Type BCBS 2022 52 visits    PT Start Time 1100    PT Stop Time 1130    PT Time Calculation (min) 30 min    Activity Tolerance Patient tolerated treatment well    Behavior During Therapy [facility] for tasks assessed/performed             Past Medical History:  Diagnosis Date   Migraines     Past Surgical History:  Procedure Laterality Date   CHOLECYSTECTOMY     PLACEMENT OF BREAST IMPLANTS      There were no vitals filed for this visit.   Subjective Assessment - 09/18/21 1104     Subjective HEP and symptom management    Patient is accompained by: Family member    Pertinent History 3 weeks out of surgery    Limitations Lifting;House hold activities    How long can you sit comfortably? N/A    How long can you stand comfortably? N/A    How long can you walk comfortably? N/A    Diagnostic tests nothing post-op    Patient Stated Goals to return to active lifestyle without restrictions    Currently in Pain? No/denies                           Machines: lat pull down 2x25 20 lbs  Row 25 lbs 2x25lbs  Chest press 15 lbs 2x15  Overhead press 2x25   Reviewed exercises to avoid. Reviewed progression of exercises.         Trigger Point Dry Needling - 09/18/21 0001     Consent Given? Yes    Education Handout Provided Yes    Dry Needling Comments .30x50 needled tow spots withno noticable twtitch    Upper Trapezius Response Palpable increased muscle length;Twitch  reponse elicited                   PT Education - 09/18/21 1120     Education Details reivewed final HEP    Person(s) Educated Patient    Methods Explanation;Demonstration;Tactile cues;Verbal cues    Comprehension Verbalized understanding;Returned demonstration;Verbal cues required;Tactile cues required              PT Short Term Goals - 09/18/21 1129       PT SHORT TERM GOAL #1   Title Patient will increase passive ER to 75 degrees    Baseline full passive per visual inspection    Time 3    Period Weeks    Status Achieved      PT SHORT TERM GOAL #2   Title Patient will incrrease passive shoulder flexion to 120 degrees    Baseline full    Period Weeks    Status Achieved      PT SHORT TERM GOAL #3   Title Patient will be indepdnent with a base scpaular strengthening and isomtric program    Time 3    Period Weeks    Status  Achieved               PT Long Term Goals - 09/18/21 1129       PT LONG TERM GOAL #1   Title Patient will reach overhead to a shelf without increased pain    Baseline reaching overhead without pain    Time 8    Period Weeks    Status Achieved      PT LONG TERM GOAL #2   Title Patient will reach behind her head to do her hair without pain    Time 8    Period Weeks    Status Achieved      PT LONG TERM GOAL #3   Title Patient will reach behind her back to L4 without pain in order tp perfrom self care tasks    Baseline can reach to t5    Time 8    Period Weeks    Status Achieved                   Plan - 09/18/21 1122     Clinical Impression Statement Patient comes in for a one month follow up to review her exercises. She has made ecellent progress. She has intemittent pain at times but for the most part she is pain free. She has full range. She did have a trigger point today. Therapy needled the trigger point. She had a similar trigger point on the other side. She was able to perfrom gym exercises independenlty  and set up her machines without a problem. We will D/C to HEP at this time.    Personal Factors and Comorbidities Comorbidity 1    Comorbidities migranes    Examination-Activity Limitations Dressing    Examination-Participation Restrictions Community Activity;Laundry;Yard Work;Volunteer;Cleaning    Stability/Clinical Decision Making Stable/Uncomplicated    Clinical Decision Making Low    Rehab Potential Excellent    PT Frequency 2x / week    PT Duration 8 weeks    PT Treatment/Interventions ADLs/Self Care Home Management;Aquatic Therapy;Cryotherapy;Moist Heat;Functional mobility training;Therapeutic activities;Therapeutic exercise;Neuromuscular re-education;Patient/family education;Scar mobilization;Passive range of motion;Energy conservation;Dry needling;Splinting;Taping;Vasopneumatic Device;Joint Manipulations    PT Next Visit Plan ue to rpogress strengthening as tolerated.    PT Home Exercise Plan Cane assisted external rotation, traps shrug relax, pendulum  [id number] see updated HEP    Consulted and Agree with Plan of Care Patient             Patient will benefit from skilled therapeutic intervention in order to improve the following deficits and impairments:  Decreased endurance, Decreased range of motion, Decreased safety awareness, Increased fascial restricitons, Pain, Increased muscle spasms, Impaired UE functional use, Decreased strength  Visit Diagnosis: Stiffness of right shoulder, not elsewhere classified  Acute pain of right shoulder  Muscle weakness (generalized)     Problem List Patient Active Problem List   Diagnosis Date Noted   DEPRESSION 12/09/2007   MIGRAINE, CHRONIC 12/09/2007   ESOPHAGITIS 12/09/2007   GERD 12/09/2007   PEPTIC ULCER DISEASE 12/09/2007   DYSPEPSIA 12/09/2007   DIARRHEA 12/09/2007   CHOLELITHIASIS, HX OF 12/09/2007    [name], PT 09/18/2021, 11:45 AM  [facility] [address] [place], [place], [address] Phone: [phone]   Fax:  [phone]  Name: Caitlin Martinez MRN: [id number] Date of Birth: August 26, 1971

## 2022-03-31 IMAGING — DX DG FINGER LITTLE 2+V*R*
3 series · 3 of 3 positions shown · non-contrast
Comparison: None.

CLINICAL DATA: Crush injury 1 day ago with fifth digit pain,
initial encounter

EXAM:
RIGHT LITTLE FINGER 2+V

[finger ap]
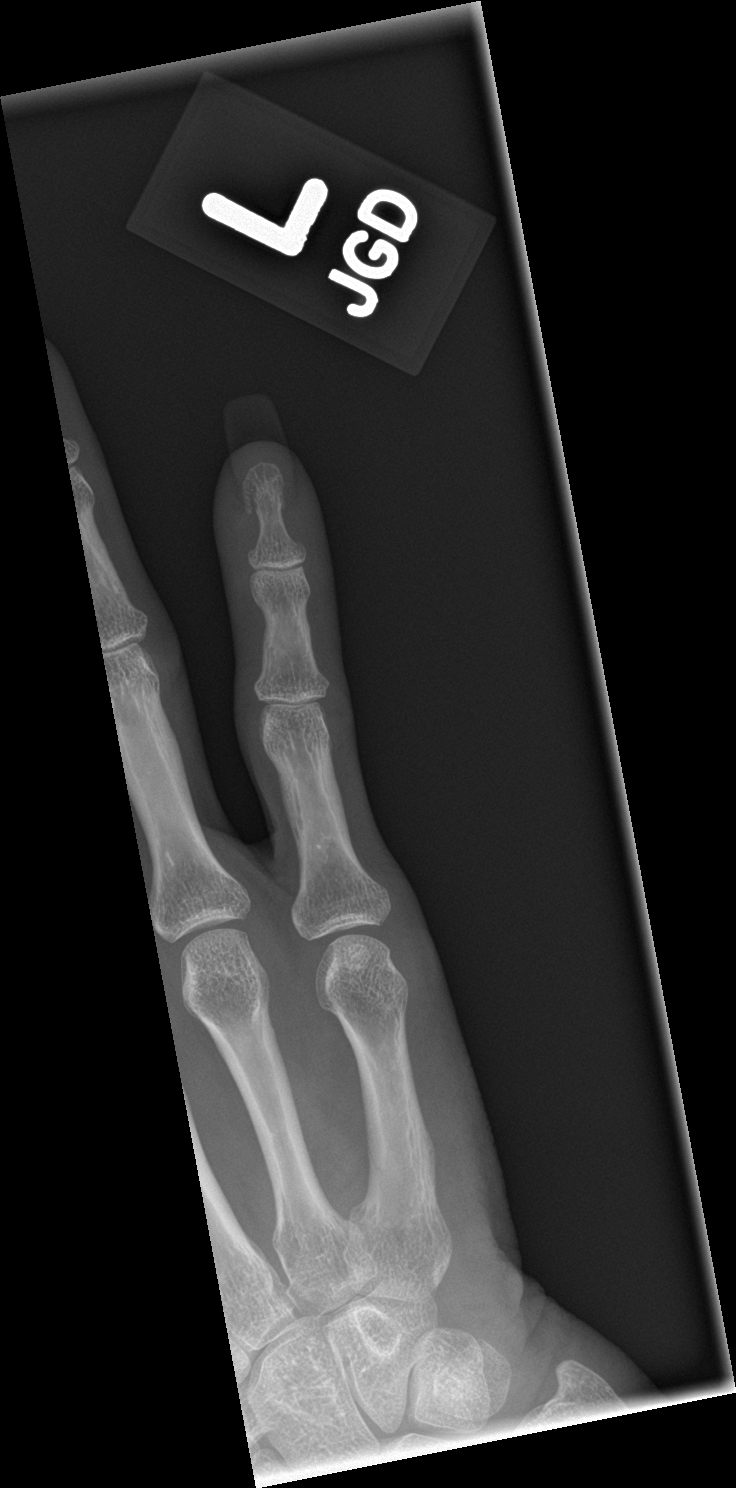

[finger obl]
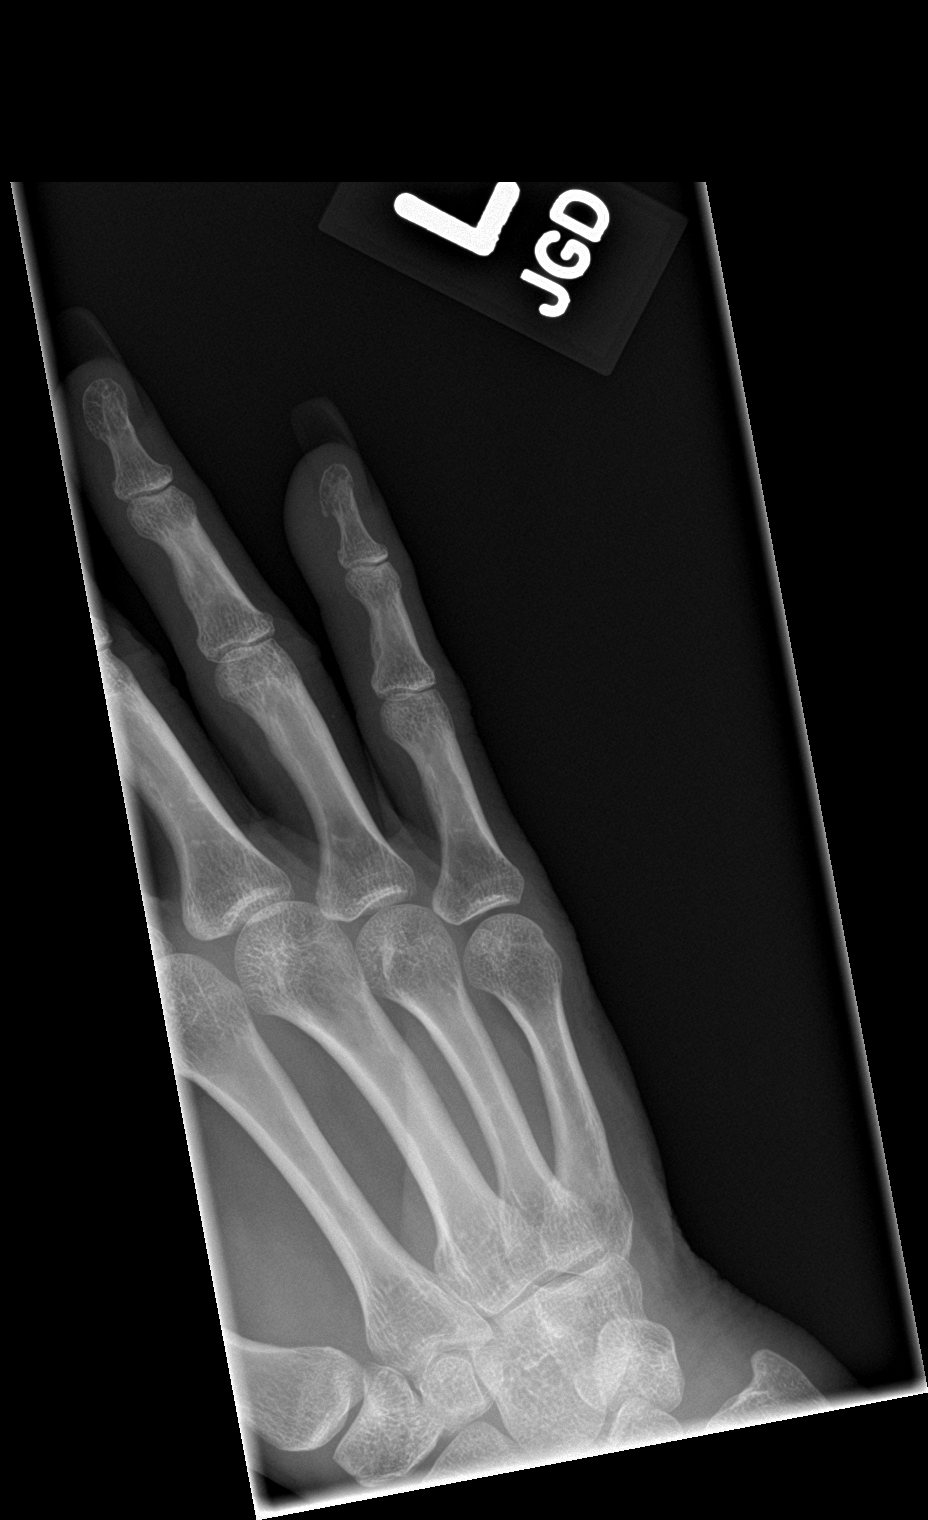

[finger lat]
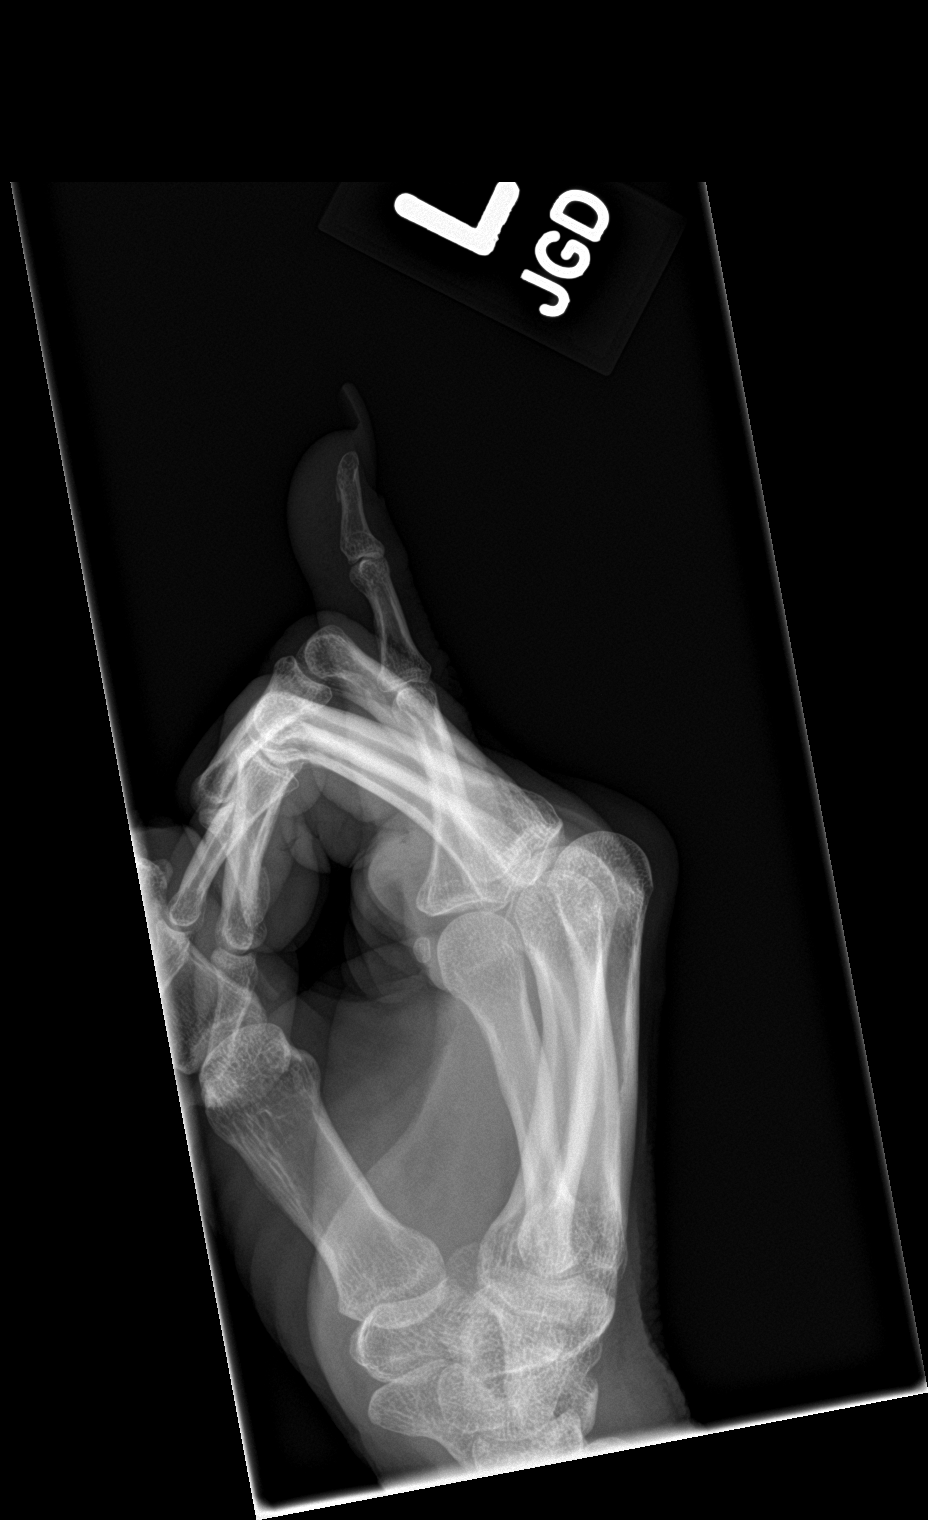

[3 of 3 positions shown; findings below may reference images not displayed]

FINDINGS: There is no evidence of fracture or dislocation. There is no
evidence of arthropathy or other focal bone abnormality. Soft
tissues are unremarkable.
IMPRESSION: No acute abnormality noted.

## 2022-05-20 IMAGING — XA DG FLUORO GUIDE NDL PLC/BX
1 series · 1 of 1 positions shown · non-contrast
Comparison: none

CLINICAL DATA: Right shoulder pain.  Injury.

[Series 1: ortho adipose · 1 of 1 slices shown]
[im 1/1]
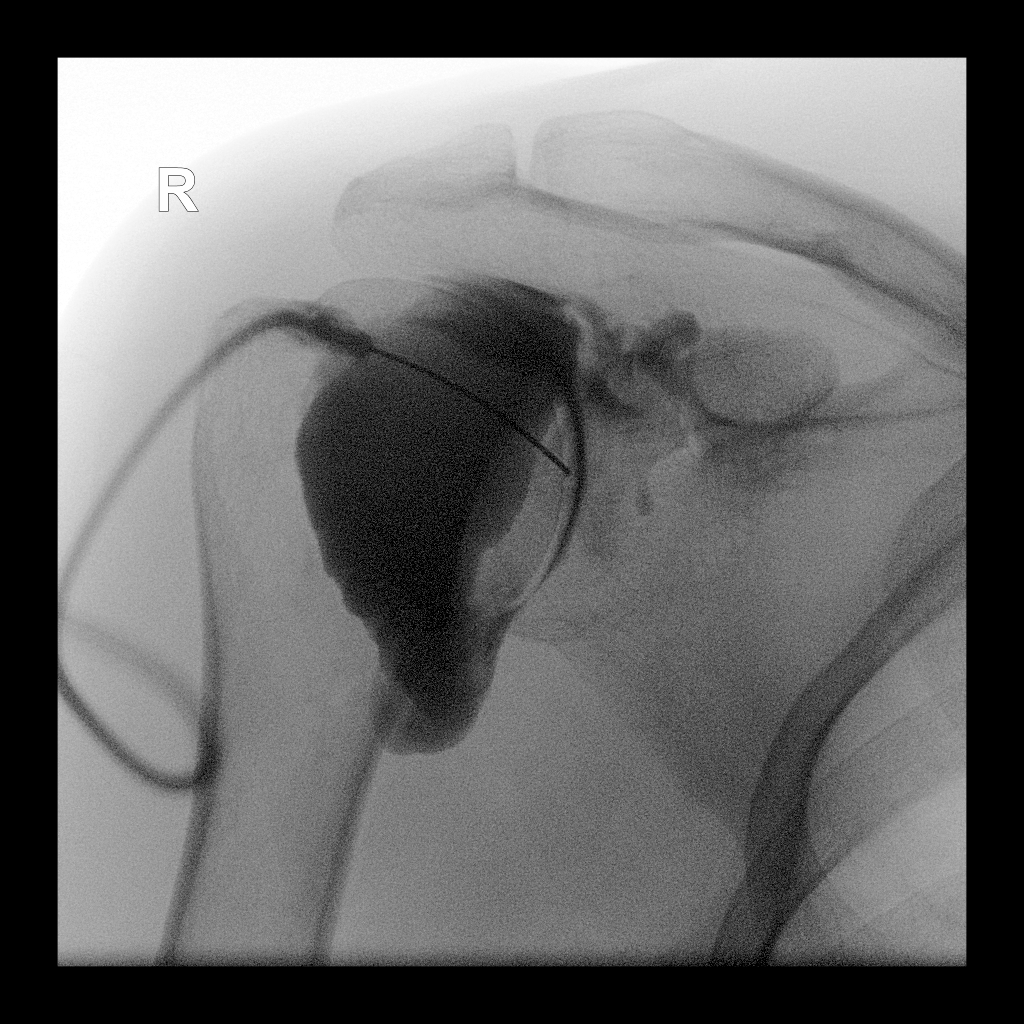

[1 of 1 positions shown; findings below may reference images not displayed]

FLUOROSCOPY TIME:  Radiation Exposure Index (as provided by the
fluoroscopic device): 6.82 uGy*m2

PROCEDURE:
Right SHOULDER INJECTION UNDER FLUOROSCOPY

An appropriate skin entrance site was determined. The site was
marked, prepped with Betadine, draped in the usual sterile fashion,
and infiltrated locally with buffered Lidocaine. 22 gauge spinal
needle was advanced to the superomedial margin of the humeral head
under intermittent fluoroscopy. 1 ml of Lidocaine injected easily. A
mixture of 0.1 ml Multihance and 20 ml of dilute Isovue M 200 was
then used to opacify the right shoulder capsule. No immediate
complication.
IMPRESSION: Technically successful right shoulder injection for MRI.

## 2022-07-04 ENCOUNTER — Other Ambulatory Visit: Payer: Self-pay | Admitting: Obstetrics & Gynecology

## 2022-07-04 DIAGNOSIS — Z1231 Encounter for screening mammogram for malignant neoplasm of breast: Secondary | ICD-10-CM

## 2022-08-26 ENCOUNTER — Ambulatory Visit
Admission: RE | Admit: 2022-08-26 | Discharge: 2022-08-26 | Disposition: A | Discharge status: Home / Self Care | Payer: BC Managed Care – PPO | Source: Ambulatory Visit | Attending: Obstetrics & Gynecology | Admitting: Obstetrics & Gynecology

## 2022-08-26 DIAGNOSIS — Z1231 Encounter for screening mammogram for malignant neoplasm of breast: Secondary | ICD-10-CM

## 2024-02-24 ENCOUNTER — Encounter: Payer: Self-pay | Admitting: Gastroenterology

## 2024-03-01 ENCOUNTER — Encounter: Payer: Self-pay | Admitting: Obstetrics and Gynecology

## 2024-03-23 ENCOUNTER — Ambulatory Visit: Admitting: Orthopedic Surgery

## 2024-04-07 ENCOUNTER — Encounter: Payer: Self-pay | Admitting: Gastroenterology

## 2024-04-07 ENCOUNTER — Ambulatory Visit: Admitting: Gastroenterology

## 2024-04-07 ENCOUNTER — Other Ambulatory Visit (INDEPENDENT_AMBULATORY_CARE_PROVIDER_SITE_OTHER)

## 2024-04-07 VITALS — BP 98/64 | HR 66 | Ht 62.0 in | Wt 129.4 lb

## 2024-04-07 DIAGNOSIS — Z1211 Encounter for screening for malignant neoplasm of colon: Secondary | ICD-10-CM

## 2024-04-07 DIAGNOSIS — R198 Other specified symptoms and signs involving the digestive system and abdomen: Secondary | ICD-10-CM

## 2024-04-07 DIAGNOSIS — K219 Gastro-esophageal reflux disease without esophagitis: Secondary | ICD-10-CM

## 2024-04-07 DIAGNOSIS — Z8379 Family history of other diseases of the digestive system: Secondary | ICD-10-CM

## 2024-04-07 DIAGNOSIS — Z83719 Family history of colon polyps, unspecified: Secondary | ICD-10-CM

## 2024-04-07 DIAGNOSIS — R790 Abnormal level of blood mineral: Secondary | ICD-10-CM | POA: Diagnosis not present

## 2024-04-07 DIAGNOSIS — R110 Nausea: Secondary | ICD-10-CM

## 2024-04-07 DIAGNOSIS — R197 Diarrhea, unspecified: Secondary | ICD-10-CM

## 2024-04-07 DIAGNOSIS — R1013 Epigastric pain: Secondary | ICD-10-CM | POA: Diagnosis not present

## 2024-04-07 DIAGNOSIS — K5900 Constipation, unspecified: Secondary | ICD-10-CM

## 2024-04-07 LAB — CBC WITH DIFFERENTIAL/PLATELET
Basophils Absolute: 0 10*3/uL (ref 0.0–0.1)
Basophils Relative: 1 % (ref 0.0–3.0)
Eosinophils Absolute: 0.1 10*3/uL (ref 0.0–0.7)
Eosinophils Relative: 1.6 % (ref 0.0–5.0)
HCT: 38.9 % (ref 36.0–46.0)
Hemoglobin: 12.8 g/dL (ref 12.0–15.0)
Lymphocytes Relative: 30.2 % (ref 12.0–46.0)
Lymphs Abs: 1.3 10*3/uL (ref 0.7–4.0)
MCHC: 33 g/dL (ref 30.0–36.0)
MCV: 95 fl (ref 78.0–100.0)
Monocytes Absolute: 0.3 10*3/uL (ref 0.1–1.0)
Monocytes Relative: 8.3 % (ref 3.0–12.0)
Neutro Abs: 2.5 10*3/uL (ref 1.4–7.7)
Neutrophils Relative %: 58.9 % (ref 43.0–77.0)
Platelets: 207 10*3/uL (ref 150.0–400.0)
RBC: 4.09 Mil/uL (ref 3.87–5.11)
RDW: 13.3 % (ref 11.5–15.5)
WBC: 4.2 10*3/uL (ref 4.0–10.5)

## 2024-04-07 LAB — IBC + FERRITIN
Ferritin: 7.7 ng/mL — ABNORMAL LOW (ref 10.0–291.0)
Iron: 76 ug/dL (ref 42–145)
Saturation Ratios: 20.4 % (ref 20.0–50.0)
TIBC: 372.4 ug/dL (ref 250.0–450.0)
Transferrin: 266 mg/dL (ref 212.0–360.0)

## 2024-04-07 MED ORDER — NA SULFATE-K SULFATE-MG SULF 17.5-3.13-1.6 GM/177ML PO SOLN: 1.0000 | ORAL | 0 refills | Status: DC

## 2024-04-07 NOTE — Patient Instructions (Signed)
 Your provider has requested that you go to the basement level for lab work before leaving today. Press B on the elevator. The lab is located at the first door on the left as you exit the elevator.   We have sent the following medications to your pharmacy for you to pick up at your convenience: Suprep   Due to recent changes in healthcare laws, you may see the results of your imaging and laboratory studies on MyChart before your provider has had a chance to review them.  We understand that in some cases there may be results that are confusing or concerning to you. Not all laboratory results come back in the same time frame and the provider may be waiting for multiple results in order to interpret others.  Please give us  48 hours in order for your provider to thoroughly review all the results before contacting the office for clarification of your results.   You have been scheduled for an endoscopy and colonoscopy. Please follow the written instructions given to you at your visit today.  If you use inhalers (even only as needed), please bring them with you on the day of your procedure.  DO NOT TAKE 7 DAYS PRIOR TO TEST- Trulicity (dulaglutide) Ozempic, Wegovy (semaglutide) Mounjaro (tirzepatide) Bydureon Bcise (exanatide extended release)  DO NOT TAKE 1 DAY PRIOR TO YOUR TEST Rybelsus (semaglutide) Adlyxin (lixisenatide) Victoza (liraglutide) Byetta (exanatide) ___________________________________________________________________________  Thank you for choosing me and [facility].

## 2024-04-07 NOTE — Progress Notes (Signed)
 Caitlin Martinez [id number] 1971-05-02   Chief Complaint: Discuss colonoscopy/EGD  Referring Provider: Allan Ishihara, MD Primary GI MD: Para Bold (previous Dr. Arvie Latus)  HPI: Caitlin Martinez is a 53 y.o. female with past medical history of migraines, kidney stones, cholecystectomy, endometrial ablation who presents today to discuss colonoscopy and upper endoscopy.  She had an EGD in 2008 for history of gastric ulcers and GERD.  Had findings of mild EG junction inflammation, negative Barrett's.  Labs 02/13/2024: Ferritin 13, iron 50, iron saturation 14%, vitamin B12 375, folate 7.5, TSH 1.74, HgbA1C 5.2, LDL 107 otherwise normal lipid panel, unremarkable CMP, Hgb 12.2, MCV 100, platelets 248   Patient states she had a colonoscopy 15 or 20 years ago.  May have been done at another practice.  She reports having alternating diarrhea and constipation.  She takes MiraLAX as needed for constipation which helps.  On days that she has loose stools she can have multiple bowel movements per day.  She denies any rectal bleeding or melena.  She also has intermittent epigastric pain and nausea which lasts for a couple weeks and then resolves.  Comes and goes.  This has been ongoing for 4 to 5 years and she notices a strong correlation with stress.  States she has been under a lot of stress in the last several years.  She has taken Prilosec as needed.  Gets this over-the-counter.  May take 2 to 3 tablets daily as needed when symptoms arise.  States she does not like taking medications.  She denies dysphagia, acid reflux, heartburn, vomiting.  She has history of abnormal uterine bleeding with onset in 2020, had heavy perimenopausal cycles and is s/p endometrial ablation 12/2018.  States she now no longer has menstrual bleeding/is postmenopausal.  Patient states her mother either had ulcerative colitis or diverticulitis, and colon polyps.  She has a niece with Crohn's disease.   She denies family history of colon cancer, stomach cancer, esophageal cancer.  She reports intermittent swelling of her right leg which has been ongoing for years.  Recently had evaluation at the [facility] with signs and symptoms of venous insufficiency.  Comprehensive lower extremity venous evaluation with ultrasound identified only mildly positive reflux in the venous system.  She has also been referred to cardiology for further evaluation and has an appointment in August.  She reports family history of cardiac disease.  States her father had heart disease and died from fluid overload in relation to this.  States her mother had some kind of arrhythmia requiring a pacemaker which she never had placed, she is no longer living.  Her son has an innocent heart murmur.  And her brother has heart problems as well.  Patient denies any known heart or lung problems of her own. She has some intermittent chest discomfort which she attributes to stress.  States she had this evaluated initially when she was 55 and was told it was an anxiety attack. She is very active and exercises without any chest pain or shortness of breath.  Previous GI Procedures/Imaging   CT urogram 11/17/2023 1. Nonobstructing 4 mm calculus in the proximal left ureter. 2. No hydronephrosis. 3. Additional nonobstructing left nephrolithiasis.   EGD 06/29/2007 - Normal proximal esophagus to mid esophagus - Mucosal abnormality of the distal esophagus, biopsied. - Irregular GE junction, biopsied. - Normal fundus to duodenum second portion Path: ESOPHAGOGASTRIC JUNCTION MUCOSA WITH MILD INFLAMMATION. NO   INTESTINAL METAPLASIA, DYSPLASIA OR MALIGNANCY IDENTIFIED   (  BIOPSY)     Past Medical History:  Diagnosis Date   Migraines     Past Surgical History:  Procedure Laterality Date   AUGMENTATION MAMMAPLASTY Bilateral 2016   CHOLECYSTECTOMY     PLACEMENT OF BREAST IMPLANTS      Current Outpatient Medications   Medication Sig Dispense Refill   ALPRAZolam (XANAX) 0.25 MG tablet TAKE 1 TABLET BY MOUTH EVERY DAY AS NEEDED SLEEP  1   Biotin 1 MG CAPS biotin (Patient not taking: Reported on 05/29/2021)     carisoprodol (SOMA) 350 MG tablet Take 350 mg by mouth. (Patient not taking: Reported on 05/29/2021)     Collagen 500 MG CAPS collagen (Patient not taking: Reported on 05/29/2021)     Cyanocobalamin 2500 MCG SUBL Place under the tongue. (Patient not taking: Reported on 05/29/2021)     estradiol (ESTRACE) 1 MG tablet Take 1 mg by mouth daily. (Patient not taking: Reported on 05/29/2021)  0   methocarbamol  (ROBAXIN ) 500 MG tablet Take 1 tablet (500 mg total) by mouth every 8 (eight) hours as needed. (Patient not taking: Reported on 05/29/2021) 30 tablet 0   methocarbamol  (ROBAXIN ) 500 MG tablet Take 1 tablet (500 mg total) by mouth every 8 (eight) hours as needed for muscle spasms. (Patient not taking: Reported on 05/29/2021) 30 tablet 0   Multiple Vitamins-Minerals (MULTIVITAMIN ADULT EXTRA C PO) multivitamin     propranolol ER (INDERAL LA) 80 MG 24 hr capsule TAKE 1 CAP BY MOUTH DAILY--09/21/16 NEXT FILL-- (Patient not taking: Reported on 05/29/2021)  5   No current facility-administered medications for this visit.    Allergies as of 04/07/2024 - Review Complete 09/18/2021  Allergen Reaction Noted   Nsaids  01/30/2016    Family History  Problem Relation Age of Onset   Hypertension Mother    Diabetes Father    Diabetes Brother     Social History   Tobacco Use   Smoking status: Never   Smokeless tobacco: Never  Substance Use Topics   Alcohol use: Yes   Drug use: No     Review of Systems:    Constitutional: No weight loss, fever, chills, weakness or fatigue Eyes: No change in vision Ears, Nose, Throat:  No change in hearing or congestion Skin: No rash or itching Cardiovascular: No chest pain, chest pressure or palpitations   Respiratory: No SOB or cough Gastrointestinal: See HPI and  otherwise negative Genitourinary: No dysuria or change in urinary frequency Neurological: No headache, dizziness or syncope Musculoskeletal: No new muscle or joint pain Hematologic: No bleeding or bruising    Physical Exam:  Vital signs: BP 98/64 (BP Location: Right Arm, Patient Position: Sitting, Cuff Size: Normal)   Pulse 66   Ht 5' 2 (1.575 m)   Wt 129 lb 6 oz (58.7 kg)   BMI 23.66 kg/m    Constitutional: NAD, Well developed, Well nourished, alert and cooperative Head:  Normocephalic and atraumatic.  Eyes: No scleral icterus. Conjunctiva pink. Mouth: No oral lesions. Respiratory: Respirations even and unlabored. Lungs clear to auscultation bilaterally.  No wheezes, crackles, or rhonchi.  Cardiovascular:  Regular rate and rhythm. No murmurs. No peripheral edema. Gastrointestinal:  Soft, nondistended, nontender. No rebound or guarding. Normal bowel sounds. No appreciable masses or hepatomegaly. Rectal:  Not performed.  Neurologic:  Alert and oriented x4;  grossly normal neurologically.  Skin:   Dry and intact without significant lesions or rashes. Psychiatric: Oriented to person, place and time. Demonstrates good judgement and reason without abnormal  affect or behaviors.   RELEVANT LABS AND IMAGING: CBC    Component Value Date/Time   WBC 13.2 (H) 03/30/2010 0508   RBC 3.40 (L) 03/30/2010 0508   HGB 11.3 (L) 03/30/2010 0508   HCT 33.7 (L) 03/30/2010 0508   PLT 147 (L) 03/30/2010 0508   MCV 99.0 03/30/2010 0508   MCHC 33.7 03/30/2010 0508   RDW 13.3 03/30/2010 0508     Assessment/Plan:   Screening for colon cancer - Schedule colonoscopy. I thoroughly discussed the procedure with the patient to include nature of the procedure, alternatives, benefits, and risks (including but not limited to bleeding, infection, perforation, anesthesia/cardiac/pulmonary complications). Patient verbalized understanding and gave verbal consent to proceed with procedure.   Epigastric  pain Nausea Low ferritin Patient with intermittent epigastric pain and nausea, previously had an EGD in 2008 with finding of mild inflammation of the EGJ.  She reports history of gastric ulcers and GERD.  Takes Prilosec only as needed since she does not like taking medications.  Symptoms come and go but can be present for a couple weeks at a time.  No vomiting, acid reflux, heartburn, dysphagia.  - Repeat CBC, CMP, iron/ferritin - Schedule EGD to be done with screening colonoscopy. - Discussed trial of daily Pepcid, EGD findings to help determine need for PPI.  Alternating diarrhea and constipation  Patient with alternating constipation and diarrhea.  No rectal bleeding or significant abdominal pain associated with this.  Has relief of constipation with MiraLAX as needed.  - Continue Miralax as needed   *Patient has upcoming evaluation with cardiology in August.  Tentatively scheduled procedures, and will discuss with team regarding whether to wait for patient to have cardiac evaluation.   Valiant Gaul, PA-C [facility] 04/07/2024, 7:59 AM  Patient Care Team: Allan Ishihara, MD as PCP - General (Family Medicine)

## 2024-04-08 ENCOUNTER — Other Ambulatory Visit: Payer: Self-pay

## 2024-04-08 ENCOUNTER — Ambulatory Visit: Payer: Self-pay | Admitting: Gastroenterology

## 2024-04-08 DIAGNOSIS — R790 Abnormal level of blood mineral: Secondary | ICD-10-CM

## 2024-04-08 NOTE — Progress Notes (Signed)
 ____________________________________________________________  Attending physician addendum:  Thank you for sending this case to me. I have reviewed the entire note and agree with the plan.  Given her reported good exercise tolerance without chest pain or dyspnea, and the indication for cardiology evaluation being unilateral intermittent leg swelling, my opinion is she can have her procedures in the [facility] as scheduled prior to cardiology evaluation.  Lorella Roles, MD  ____________________________________________________________

## 2024-05-25 ENCOUNTER — Encounter: Payer: Self-pay | Admitting: Gastroenterology

## 2024-05-31 NOTE — Progress Notes (Deleted)
 Cardiology Office Note:    Date:  05/31/2024   ID:  Eleanor Caldron Uniqua Kihn, DOB [birth year], MRN [id number]  PCP:  Morgan Drivers, MD   [facility] Providers Cardiologist:  None { Click to update primary MD,subspecialty MD or APP then REFRESH:1}    Referring MD: Morgan Drivers, MD   No chief complaint on file. ***  History of Present Illness:    Signa Cheek is a 53 y.o. female is seen at the request of Dr Rox for evaluation of edema.   Past Medical History:  Diagnosis Date   Migraines     Past Surgical History:  Procedure Laterality Date   AUGMENTATION MAMMAPLASTY Bilateral 2016   CHOLECYSTECTOMY     PLACEMENT OF BREAST IMPLANTS      Current Medications: No outpatient medications have been marked as taking for the 06/13/24 encounter (Appointment) with [place], Estoria Geary M, MD.     Allergies:   Nsaids   Social History   Socioeconomic History   Marital status: Married    Spouse name: Not on file   Number of children: Not on file   Years of education: Not on file   Highest education level: Not on file  Occupational History   Not on file  Tobacco Use   Smoking status: Never   Smokeless tobacco: Never  Vaping Use   Vaping status: Never Used  Substance and Sexual Activity   Alcohol use: Yes   Drug use: No   Sexual activity: Not on file  Other Topics Concern   Not on file  Social History Narrative   Not on file   Social Drivers of Health   Financial Resource Strain: Not on file  Food Insecurity: Low Risk  (09/29/2023)   Received from [facility]   Hunger Vital Sign    Within the past 12 months, you worried that your food would run out before you got money to buy more: Never true    Within the past 12 months, the food you bought just didn't last and you didn't have money to get more. : Never true  Transportation Needs: No Transportation Needs (09/29/2023)   Received from [facility]    In the past 12  months, has lack of reliable transportation kept you from medical appointments, meetings, work or from getting things needed for daily living? : No  Physical Activity: High Risk (04/16/2021)   Received from [facility]   Physical Activity    How often do you engage in moderate physical activity for 30 minutes or more?: 1 to 3 days a week    How often do you engage in vigorous physical activity for 20 minutes or more?: 1 to 3 days a week    How many hours per day do you spend sitting?: More than 8 hours  Stress: High Risk (04/17/2022)   Received from [facility]   Stress    Stress in your Life: Not on file    Dealing with Stress: Not on file  Social Connections: Not on file     Family History: The patient's ***family history includes Colon polyps in her mother; Diabetes in her brother and father; Heart disease in her brother and father; Hypertension in her mother; Irritable bowel syndrome in her mother; Kidney disease in her brother and father.  ROS:   Please see the history of present illness.    *** All other systems reviewed and are negative.  EKGs/Labs/Other Studies Reviewed:  The following studies were reviewed today: ***      Recent Labs: 04/07/2024: Hemoglobin 12.8; Platelets 207.0  Recent Lipid Panel No results found for: CHOL, TRIG, HDL, CHOLHDL, VLDL, LDLCALC, LDLDIRECT   Risk Assessment/Calculations:   {Does this patient have ATRIAL FIBRILLATION?[phone]}  No BP recorded.  {Refresh Note OR Click here to enter BP  :1}***         Physical Exam:    VS:  There were no vitals taken for this visit.    Wt Readings from Last 3 Encounters:  04/07/24 129 lb 6 oz (58.7 kg)  01/17/19 123 lb (55.8 kg)  05/18/18 119 lb (54 kg)     GEN: *** Well nourished, well developed in no acute distress HEENT: Normal NECK: No JVD; No carotid bruits LYMPHATICS: No lymphadenopathy CARDIAC: ***RRR, no murmurs, rubs, gallops RESPIRATORY:  Clear to auscultation  without rales, wheezing or rhonchi  ABDOMEN: Soft, non-tender, non-distended MUSCULOSKELETAL:  No edema; No deformity  SKIN: Warm and dry NEUROLOGIC:  Alert and oriented x 3 PSYCHIATRIC:  Normal affect   ASSESSMENT:    No diagnosis found. PLAN:    In order of problems listed above:  ***      {Are you ordering a CV Procedure (e.g. stress test, cath, DCCV, TEE, etc)?   Press F2        :[id number]}    Medication Adjustments/Labs and Tests Ordered: Current medicines are reviewed at length with the patient today.  Concerns regarding medicines are outlined above.  No orders of the defined types were placed in this encounter.  No orders of the defined types were placed in this encounter.   There are no Patient Instructions on file for this visit.   Signed, Gabryel Talamo [place], MD  05/31/2024 7:33 AM    [facility]

## 2024-06-02 ENCOUNTER — Encounter: Payer: Self-pay | Admitting: Gastroenterology

## 2024-06-02 ENCOUNTER — Ambulatory Visit: Admitting: Gastroenterology

## 2024-06-02 VITALS — BP 136/96 | HR 72 | Temp 97.9°F | Resp 14 | Ht 62.0 in | Wt 129.0 lb

## 2024-06-02 DIAGNOSIS — D122 Benign neoplasm of ascending colon: Secondary | ICD-10-CM

## 2024-06-02 DIAGNOSIS — R1013 Epigastric pain: Secondary | ICD-10-CM

## 2024-06-02 DIAGNOSIS — R790 Abnormal level of blood mineral: Secondary | ICD-10-CM

## 2024-06-02 DIAGNOSIS — R198 Other specified symptoms and signs involving the digestive system and abdomen: Secondary | ICD-10-CM

## 2024-06-02 DIAGNOSIS — K6389 Other specified diseases of intestine: Secondary | ICD-10-CM

## 2024-06-02 DIAGNOSIS — K648 Other hemorrhoids: Secondary | ICD-10-CM | POA: Diagnosis not present

## 2024-06-02 DIAGNOSIS — K5730 Diverticulosis of large intestine without perforation or abscess without bleeding: Secondary | ICD-10-CM | POA: Diagnosis not present

## 2024-06-02 DIAGNOSIS — Z1211 Encounter for screening for malignant neoplasm of colon: Secondary | ICD-10-CM

## 2024-06-02 DIAGNOSIS — R110 Nausea: Secondary | ICD-10-CM

## 2024-06-02 DIAGNOSIS — R194 Change in bowel habit: Secondary | ICD-10-CM | POA: Diagnosis not present

## 2024-06-02 MED ORDER — SODIUM CHLORIDE 0.9 % IV SOLN
500.0000 mL | Freq: Once | INTRAVENOUS | Status: DC
Start: 2024-06-02 — End: 2024-06-02

## 2024-06-02 NOTE — Op Note (Signed)
 [facility] Patient Name: Caitlin Martinez Procedure Date: 06/02/2024 9:12 AM MRN: [id number] Endoscopist: Victory L. Legrand , MD, [id number] Age: 53 Referring MD:  Date of Birth: 10/28/1970 Gender: Female Account #: [id number] Procedure:                Upper GI endoscopy Indications:              Epigastric abdominal pain, Nausea, Low ferritin and                            iron saturation with normal hemoglobin Medicines:                Monitored Anesthesia Care Procedure:                Pre-Anesthesia Assessment:                           - Prior to the procedure, a History and Physical                            was performed, and patient medications and                            allergies were reviewed. The patient's tolerance of                            previous anesthesia was also reviewed. The risks                            and benefits of the procedure and the sedation                            options and risks were discussed with the patient.                            All questions were answered, and informed consent                            was obtained. Prior Anticoagulants: The patient has                            taken no anticoagulant or antiplatelet agents. ASA                            Grade Assessment: II - A patient with mild systemic                            disease. After reviewing the risks and benefits,                            the patient was deemed in satisfactory condition to                            undergo the procedure.  After obtaining informed consent, the endoscope was                            passed under direct vision. Throughout the                            procedure, the patient's blood pressure, pulse, and                            oxygen saturations were monitored continuously. The                            GIF [id number] #[id number] was introduced through the                            mouth, and  advanced to the second part of duodenum.                            The upper GI endoscopy was accomplished without                            difficulty. The patient tolerated the procedure                            well. Scope In: Scope Out: Findings:                 The esophagus was normal.                           Normal mucosa was found in the entire examined                            stomach. Biopsies were taken with a cold forceps                            for histology. (Antrum and body in same pathology                            jar)                           The cardia and gastric fundus were normal on                            retroflexion.                           Normal mucosa was found in the entire duodenum.                            Biopsies for histology were taken with a cold                            forceps for evaluation of celiac disease. Complications:  No immediate complications. Estimated Blood Loss:     Estimated blood loss was minimal. Impression:               - Normal esophagus.                           - Normal mucosa was found in the entire stomach.                            Biopsied.                           - Normal mucosa was found in the entire examined                            duodenum. Biopsied.                           No visible source of symptoms or iron deficiency on                            this exam. Recommendation:           - Patient has a contact number available for                            emergencies. The signs and symptoms of potential                            delayed complications were discussed with the                            patient. Return to normal activities tomorrow.                            Written discharge instructions were provided to the                            patient.                           - Resume previous diet.                           - Continue present medications.                            - Await pathology results.                           - See the other procedure note for documentation of                            additional recommendations.                           - Follow-up with APP next available clinic  appointment Victory L. Legrand, MD 06/02/2024 9:54:03 AM This report has been signed electronically.

## 2024-06-02 NOTE — Progress Notes (Signed)
 Called to room to assist during endoscopic procedure.  Patient ID and intended procedure confirmed with present staff. Received instructions for my participation in the procedure from the performing physician.

## 2024-06-02 NOTE — Progress Notes (Signed)
 Report given to PACU, vss

## 2024-06-02 NOTE — Progress Notes (Signed)
 History and Physical:  This patient presents for endoscopic testing for: Encounter Diagnoses  Name Primary?   Low ferritin Yes   Alternating constipation and diarrhea    Abdominal pain, epigastric    Nausea without vomiting     For 53-year-old woman here today for endoscopic evaluation of multiple GI symptoms.  Clinical details of this are outlined in our APP office consult note dated 04/07/2024, with no significant clinical changes since then.  This patient has epigastric pain, nausea and vomiting, irregular bowel habits and low ferritin and iron saturation with normal hemoglobin.  Patient is otherwise without complaints or active issues today.   Past Medical History: Past Medical History:  Diagnosis Date   Anemia    GERD (gastroesophageal reflux disease)    Migraines      Past Surgical History: Past Surgical History:  Procedure Laterality Date   AUGMENTATION MAMMAPLASTY Bilateral 2016   CHOLECYSTECTOMY     PLACEMENT OF BREAST IMPLANTS     SHOULDER SURGERY Right 1995    Allergies: Allergies  Allergen Reactions   Nsaids     Other reaction(s): GI Upset (intolerance)    Outpatient Meds: Current Outpatient Medications  Medication Sig Dispense Refill   Ketorolac  Tromethamine  15.75 MG/SPRAY SOLN Place 1 each into the nose.     Na Sulfate-K Sulfate-Mg Sulfate concentrate (SUPREP BOWEL PREP KIT) 17.5-3.13-1.6 GM/177ML SOLN Take 1 kit (354 mLs total) by mouth as directed. For colonoscopy prep 354 mL 0   ALPRAZolam (XANAX) 0.25 MG tablet TAKE 1 TABLET BY MOUTH EVERY DAY AS NEEDED SLEEP (Patient taking differently: Take 0.25 mg by mouth as needed.)  1   Diethylpropion HCl CR 75 MG TB24 Take 1 tablet by mouth every morning.     phentermine (ADIPEX-P) 37.5 MG tablet Take 37.5 mg by mouth every morning.     triamterene-hydrochlorothiazide (MAXZIDE-25) 37.5-25 MG tablet Take 1 tablet by mouth daily.     Current Facility-Administered Medications  Medication Dose Route Frequency  Provider Last Rate Last Admin   0.9 %  sodium chloride  infusion  500 mL Intravenous Once Danis, Victory CROME III, MD          ___________________________________________________________________ Objective   Exam:  BP 119/80   Pulse 65   Temp 97.9 F (36.6 C) (Temporal)   Resp 15   Ht 5' 2 (1.575 m)   Wt 129 lb (58.5 kg)   SpO2 100%   BMI 23.59 kg/m   CV: regular , S1/S2 Resp: clear to auscultation bilaterally, normal RR and effort noted GI: soft, no tenderness, with active bowel sounds.   Assessment: Encounter Diagnoses  Name Primary?   Low ferritin Yes   Alternating constipation and diarrhea    Abdominal pain, epigastric    Nausea without vomiting      Plan: Colonoscopy EGD  The benefits and risks of the planned procedure(s) were described in detail with the patient or (when appropriate) their health care proxy.  Risks were outlined as including, but not limited to, bleeding, infection, perforation, adverse medication reaction leading to cardiac or pulmonary decompensation, pancreatitis (if ERCP).  The limitation of incomplete mucosal visualization was also discussed.  No guarantees or warranties were given.  The patient is appropriate for an endoscopic procedure in the ambulatory setting.   - Victory Brand, MD

## 2024-06-02 NOTE — Op Note (Signed)
 [facility] Patient Name: Caitlin Martinez Procedure Date: 06/02/2024 9:12 AM MRN: [id number] Endoscopist: Victory L. Legrand , MD, [id number] Age: 53 Referring MD:  Date of Birth: 1971-02-12 Gender: Female Account #: [id number] Procedure:                Colonoscopy Indications:              Change in bowel habits -alternating constipation                            and diarrhea                           Low ferritin and iron saturation with normal                            hemoglobin                           Clinical details in 04/14/2024 office consult note Medicines:                Monitored Anesthesia Care Procedure:                Pre-Anesthesia Assessment:                           - Prior to the procedure, a History and Physical                            was performed, and patient medications and                            allergies were reviewed. The patient's tolerance of                            previous anesthesia was also reviewed. The risks                            and benefits of the procedure and the sedation                            options and risks were discussed with the patient.                            All questions were answered, and informed consent                            was obtained. Prior Anticoagulants: The patient has                            taken no anticoagulant or antiplatelet agents. ASA                            Grade Assessment: II - A patient with mild systemic  disease. After reviewing the risks and benefits,                            the patient was deemed in satisfactory condition to                            undergo the procedure.                           After obtaining informed consent, the colonoscope                            was passed under direct vision. Throughout the                            procedure, the patient's blood pressure, pulse, and                            oxygen  saturations were monitored continuously. The                            CF [id number] #[id number] was introduced through the anus                            and advanced to the the cecum, identified by                            appendiceal orifice and ileocecal valve. The                            colonoscopy was performed without difficulty. The                            patient tolerated the procedure well. The quality                            of the bowel preparation was good. The ileocecal                            valve, appendiceal orifice, and rectum were                            photographed. Scope In: 9:19:04 AM Scope Out: 9:34:42 AM Scope Withdrawal Time: 0 hours 12 minutes 50 seconds  Total Procedure Duration: 0 hours 15 minutes 38 seconds  Findings:                 The perianal and digital rectal examinations were                            normal.                           A diffuse area of melanosis was found in the entire  colon. Biopsies for histology were taken with a                            cold forceps from the right colon and left colon                            for evaluation of microscopic colitis.                           A diminutive polyp was found in the ascending                            colon. The polyp was sessile. The polyp was removed                            with a cold snare. Resection and retrieval were                            complete.                           Multiple diverticula were found in the left colon.                           Internal hemorrhoids were found.                           The exam was otherwise without abnormality on                            direct and retroflexion views. Complications:            No immediate complications. Estimated Blood Loss:     Estimated blood loss was minimal. Impression:               - Melanosis in the colon. Biopsied.                           - One diminutive  polyp in the ascending colon,                            removed with a cold snare. Resected and retrieved.                           - Diverticulosis in the left colon.                           - Internal hemorrhoids.                           - The examination was otherwise normal on direct                            and retroflexion views. Recommendation:           - Patient has a contact number available for  emergencies. The signs and symptoms of potential                            delayed complications were discussed with the                            patient. Return to normal activities tomorrow.                            Written discharge instructions were provided to the                            patient.                           - Resume previous diet.                           - Continue present medications.                           - Await pathology results.                           - Repeat colonoscopy is recommended for                            surveillance. The colonoscopy date will be                            determined after pathology results from today's                            exam become available for review.                           - See the other procedure note for documentation of                            additional recommendations.                           - Follow-up in clinic with APP-next available Victory L. Legrand, MD 06/02/2024 9:39:03 AM This report has been signed electronically.

## 2024-06-02 NOTE — Patient Instructions (Addendum)
 YOU HAD AN ENDOSCOPIC PROCEDURE TODAY AT THE [facility]:   Refer to the procedure report that was given to you for any specific questions about what was found during the examination.  If the procedure report does not answer your questions, please call your gastroenterologist to clarify.  If you requested that your care partner not be given the details of your procedure findings, then the procedure report has been included in a sealed envelope for you to review at your convenience later.  YOU SHOULD EXPECT: Some feelings of bloating in the abdomen. Passage of more gas than usual.  Walking can help get rid of the air that was put into your GI tract during the procedure and reduce the bloating. If you had a lower endoscopy (such as a colonoscopy or flexible sigmoidoscopy) you may notice spotting of blood in your stool or on the toilet paper. If you underwent a bowel prep for your procedure, you may not have a normal bowel movement for a few days.  Please Note:  You might notice some irritation and congestion in your nose or some drainage.  This is from the oxygen used during your procedure.  There is no need for concern and it should clear up in a day or so.  SYMPTOMS TO REPORT IMMEDIATELY:  Following lower endoscopy (colonoscopy or flexible sigmoidoscopy):  Excessive amounts of blood in the stool  Significant tenderness or worsening of abdominal pains  Swelling of the abdomen that is new, acute  Fever of 100F or higher  Following upper endoscopy (EGD)  Vomiting of blood or coffee ground material  New chest pain or pain under the shoulder blades  Painful or persistently difficult swallowing  New shortness of breath  Fever of 100F or higher  Black, tarry-looking stools  Colonoscopy Resume previous diet Continue present medications Await pathology results Follow up in clinic with APP for the next available  Upper endoscopy Resume previous diet Continue present medications   Await pathology results    For urgent or emergent issues, a gastroenterologist can be reached at any hour by calling [phone]. Do not use MyChart messaging for urgent concerns.    DIET:  We do recommend a small meal at first, but then you may proceed to your regular diet.  Drink plenty of fluids but you should avoid alcoholic beverages for 24 hours.  ACTIVITY:  You should plan to take it easy for the rest of today and you should NOT DRIVE or use heavy machinery until tomorrow (because of the sedation medicines used during the test).    FOLLOW UP: Our staff will call the number listed on your records the next business day following your procedure.  We will call around 7:15- 8:00 am to check on you and address any questions or concerns that you may have regarding the information given to you following your procedure. If we do not reach you, we will leave a message.     If any biopsies were taken you will be contacted by phone or by letter within the next 1-3 weeks.  Please call us  at [phone] if you have not heard about the biopsies in 3 weeks.    SIGNATURES/CONFIDENTIALITY: You and/or your care partner have signed paperwork which will be entered into your electronic medical record.  These signatures attest to the fact that that the information above on your After Visit Summary has been reviewed and is understood.  Full responsibility of the confidentiality of this discharge information lies  with you and/or your care-partner.

## 2024-06-02 NOTE — Progress Notes (Signed)
 0911 Robinul 0.1 mg IV given due large amount of secretions upon assessment.  MD made aware, vss

## 2024-06-02 NOTE — Progress Notes (Signed)
 Updated medical record w/ pt

## 2024-06-03 ENCOUNTER — Telehealth: Payer: Self-pay

## 2024-06-03 NOTE — Telephone Encounter (Signed)
 Attempted to reach patient for follow up phone call. No answer, left voicemail to contact Dr. Clayburn office with any questions or concerns.

## 2024-06-06 LAB — SURGICAL PATHOLOGY

## 2024-06-08 ENCOUNTER — Ambulatory Visit: Payer: Self-pay | Admitting: Gastroenterology

## 2024-06-13 ENCOUNTER — Ambulatory Visit: Admitting: Cardiology

## 2024-06-14 MED ORDER — LINACLOTIDE 145 MCG PO CAPS: 145.0000 ug | ORAL_CAPSULE | Freq: Every day | ORAL | Status: DC

## 2024-06-27 ENCOUNTER — Telehealth: Payer: Self-pay | Admitting: Gastroenterology

## 2024-06-27 MED ORDER — LINACLOTIDE 145 MCG PO CAPS: 145.0000 ug | ORAL_CAPSULE | Freq: Every day | ORAL | 2 refills | Status: DC

## 2024-06-27 NOTE — Telephone Encounter (Signed)
 I just sent a prescription to her pharmacy for Linzess  145 micrograms once daily #30, RF 2  Thank you  - H. Danis

## 2024-06-27 NOTE — Telephone Encounter (Signed)
 Pt informed Rx was sent and states understanding.

## 2024-06-27 NOTE — Telephone Encounter (Signed)
 Inbound call from pt requesting to get a prescription for Linzess  since her sample were helping her. Patient would like for her prescription to go over to [facility] in [employer] on [address]. Please advise.

## 2024-06-27 NOTE — Telephone Encounter (Signed)
Is refill appropriate? Please advise. 

## 2024-08-01 ENCOUNTER — Encounter: Payer: Self-pay | Admitting: Gastroenterology

## 2024-08-01 ENCOUNTER — Ambulatory Visit: Admitting: Gastroenterology

## 2024-08-01 VITALS — BP 92/58 | HR 92 | Ht 62.0 in | Wt 125.0 lb

## 2024-08-01 DIAGNOSIS — K5730 Diverticulosis of large intestine without perforation or abscess without bleeding: Secondary | ICD-10-CM

## 2024-08-01 DIAGNOSIS — Z860101 Personal history of adenomatous and serrated colon polyps: Secondary | ICD-10-CM | POA: Diagnosis not present

## 2024-08-01 DIAGNOSIS — K5909 Other constipation: Secondary | ICD-10-CM

## 2024-08-01 DIAGNOSIS — R140 Abdominal distension (gaseous): Secondary | ICD-10-CM | POA: Diagnosis not present

## 2024-08-01 DIAGNOSIS — R1032 Left lower quadrant pain: Secondary | ICD-10-CM

## 2024-08-01 MED ORDER — METRONIDAZOLE 500 MG PO TABS: 500.0000 mg | ORAL_TABLET | Freq: Two times a day (BID) | ORAL | 0 refills | Status: AC

## 2024-08-01 NOTE — Patient Instructions (Addendum)
 We have sent the following medications to your pharmacy for you to pick up at your convenience: Flagyl  _______________________________________________________  If your blood pressure at your visit was 140/90 or greater, please contact your primary care physician to follow up on this.  _______________________________________________________  If you are age 53 or older, your body mass index should be between 23-30. Your Body mass index is 22.86 kg/m. If this is out of the aforementioned range listed, please consider follow up with your Primary Care Provider.  If you are age 64 or younger, your body mass index should be between 19-25. Your Body mass index is 22.86 kg/m. If this is out of the aformentioned range listed, please consider follow up with your Primary Care Provider.   ________________________________________________________  The  GI providers would like to encourage you to use MYCHART to communicate with providers for non-urgent requests or questions.  Due to long hold times on the telephone, sending your provider a message by [facility] may be a faster and more efficient way to get a response.  Please allow 48 business hours for a response.  Please remember that this is for non-urgent requests.  _______________________________________________________  [facility] is using a team-based approach to care.  Your team is made up of your doctor and two to three APPS. Our APPS (Nurse Practitioners and Physician Assistants) work with your physician to ensure care continuity for you. They are fully qualified to address your health concerns and develop a treatment plan. They communicate directly with your gastroenterologist to care for you. Seeing the Advanced Practice Practitioners on your physician's team can help you by facilitating care more promptly, often allowing for earlier appointments, access to diagnostic testing, procedures, and other specialty referrals.    Thank  you for trusting me with your gastrointestinal care!    Dr. Victory Legrand DOUGLAS [facility]

## 2024-08-01 NOTE — Progress Notes (Signed)
  GI Progress Note  Chief Complaint: Altered bowel habits  Summary of GI history:  Clinic consult June 2025 with intermittent epigastric pain and alternating constipation and diarrhea.  Prior cholecystectomy Low ferritin and iron saturation with normal hemoglobin 06/02/2024:    EGD normal, gastric and duodenal biopsies normal                colonoscopy with diffuse melanosis, biopsies negative for microscopic colitis.  Diminutive tubular adenoma, left-sided diverticulosis, internal hemorrhoids  Subjective  HPI:  Caitlin Martinez has been feeling better since I last saw her.  Linzess  145 mcg daily was started and this has helped regulate her BMs leading to less constipation and bloating.  She is still having some intermittent left lower quadrant pain.  We discussed her procedure findings and long-term management.  ROS: Cardiovascular:  no chest pain Respiratory: no dyspnea  The patient's Past Medical, Family and Social History were reviewed and are on file in the EMR. Past Medical History:  Diagnosis Date   Anemia    GERD (gastroesophageal reflux disease)    Migraines     Past Surgical History:  Procedure Laterality Date   AUGMENTATION MAMMAPLASTY Bilateral 2016   CHOLECYSTECTOMY     PLACEMENT OF BREAST IMPLANTS     SHOULDER SURGERY Right 1995    Objective:  Med list reviewed  Current Outpatient Medications:    ALPRAZolam (XANAX) 0.25 MG tablet, TAKE 1 TABLET BY MOUTH EVERY DAY AS NEEDED SLEEP, Disp: , Rfl: 1   amoxicillin-clavulanate (AUGMENTIN) 875-125 MG tablet, Take 1 tablet by mouth 2 (two) times daily., Disp: , Rfl:    Diethylpropion HCl CR 75 MG TB24, Take 1 tablet by mouth every morning., Disp: , Rfl:    Ketorolac  Tromethamine  15.75 MG/SPRAY SOLN, Place 1 each into the nose., Disp: , Rfl:    linaclotide  (LINZESS ) 145 MCG CAPS capsule, Take 1 capsule (145 mcg total) by mouth daily before breakfast., Disp: 30 capsule, Rfl: 2   metroNIDAZOLE (FLAGYL) 500 MG  tablet, Take 1 tablet (500 mg total) by mouth 2 (two) times daily for 7 days., Disp: 14 tablet, Rfl: 0   promethazine (PHENERGAN) 25 MG tablet, Take 25 mg by mouth every 4 (four) hours as needed., Disp: , Rfl:    triamterene-hydrochlorothiazide (MAXZIDE-25) 37.5-25 MG tablet, Take 1 tablet by mouth daily., Disp: , Rfl:    KLOR-CON 20 MEQ packet, Take 20 mEq by mouth daily., Disp: , Rfl:    phentermine (ADIPEX-P) 37.5 MG tablet, Take 37.5 mg by mouth every morning. (Patient not taking: Reported on 08/01/2024), Disp: , Rfl:    Vital signs in last 24 hrs: Vitals:   08/01/24 1358  BP: (!) 92/58  Pulse: 92   Wt Readings from Last 3 Encounters:  08/01/24 125 lb (56.7 kg)  06/02/24 129 lb (58.5 kg)  04/07/24 129 lb 6 oz (58.7 kg)    Physical Exam  Well-appearing  Cardiac: Regular without appreciable murmur,  no peripheral edema Pulm: clear to auscultation bilaterally, normal RR and effort noted Abdomen: soft, LLQ tenderness, with active bowel sounds. No guarding or palpable hepatosplenomegaly. Skin; warm and dry, no jaundice or rash  Labs:   ___________________________________________ Radiologic studies:   ____________________________________________ Other:   1. Surgical [P], colon nos, random sites :      COLONIC MUCOSA WITH NO SIGNIFICANT DIAGNOSTIC ALTERATION.      NO EVIDENCE OF LYMPHOCYTIC COLITIS OR COLLAGENOUS COLITIS.      NEGATIVE FOR ACTIVITY, CHRONICITY, GRANULOMA, DYSPLASIA OR  MALIGNANCY.       2. Surgical [P], colon, ascending, polyp (1) :      TUBULAR ADENOMA.       3. Surgical [P], duodenal biopsies :      DUODENAL MUCOSA WITH PRESERVED VILLOGLANDULAR ARCHITECTURE WITHOUT INCREASED      INTRAEPITHELIAL LYMPHOCYTES OR EVIDENCE OF ACTIVE INFLAMMATION.      NO EVIDENCE OF GLUTEN SENSITIVE ENTEROPATHY.      NEGATIVE FOR DYSPLASIA.       4. Surgical [P], gastric biopsies :      GASTRIC ANTRAL/OXYNTIC MUCOSA WITH NO SIGNIFICANT DIAGNOSTIC ALTERATION.      NO H.  PYLORI IDENTIFIED ON H&E STAIN.      NEGATIVE FOR INTESTINAL METAPLASIA OR DYSPLASIA.   _____________________________________________ Assessment & Plan  Assessment: Encounter Diagnoses  Name Primary?   Chronic constipation Yes   LLQ abdominal pain    Abdominal bloating    Diverticulosis of colon without hemorrhage    Chronic constipation, and she would have some episodic diarrhea that may have been overflow or catharsis after a few days of insufficient BMs. Affects may be contributing to the constipation, also consider some perimenopausal hormonal changes. Improved lately on Linzess  and she would like to continue that.  She has persistent left lower quadrant pain that is probably related to the constipation I think there is an element of IBS.  Would prefer not to use antispasmodic so as not to worsen constipation.  Probably not a chronic smoldering diverticulitis, but she is tender in that area.  Discussed it with her and have opted to treat with 7 days of metronidazole.   Victory LITTIE Brand III

## 2024-08-08 NOTE — Progress Notes (Signed)
 Cardiology Office Note:    Date:  08/22/2024   ID:  Caitlin Martinez, DOB 07/11/1971, MRN [id number]  PCP:  Morgan Drivers, MD   [facility] Providers Cardiologist:  None     Referring MD: Morgan Drivers, MD   Chief Complaint  Patient presents with   Edema    History of Present Illness:    Caitlin Martinez is a 53 y.o. female is seen at the request of Dr Rox for evaluation of edema. She was seen in the past by Vein specialist and US  c/w superficial reflux/venous insufficiency.   She states she has intermittent swelling in her legs R>L for several years. Worse when she travels. Does use support hose when she travels and takes triamterene HCT prn. Denies any SOB. No chest pain or palpitations. She is under a lot of stress.   Past Medical History:  Diagnosis Date   Anemia    GERD (gastroesophageal reflux disease)    Migraines     Past Surgical History:  Procedure Laterality Date   AUGMENTATION MAMMAPLASTY Bilateral 2016   CHOLECYSTECTOMY     PLACEMENT OF BREAST IMPLANTS     SHOULDER SURGERY Right 1995    Current Medications: Current Meds  Medication Sig   ALPRAZolam (XANAX) 0.25 MG tablet TAKE 1 TABLET BY MOUTH EVERY DAY AS NEEDED SLEEP   Diethylpropion HCl CR 75 MG TB24 Take 1 tablet by mouth every morning.   hyoscyamine (LEVSIN SL) 0.125 MG SL tablet Place 1 tablet (0.125 mg total) under the tongue 2 (two) times daily as needed for cramping.   Ketorolac  Tromethamine  15.75 MG/SPRAY SOLN Place 1 each into the nose.   linaclotide  (LINZESS ) 145 MCG CAPS capsule Take 1 capsule (145 mcg total) by mouth daily before breakfast.   metroNIDAZOLE (FLAGYL) 500 MG tablet Take 1 tablet (500 mg total) by mouth 2 (two) times daily for 7 days.   promethazine (PHENERGAN) 25 MG tablet Take 25 mg by mouth every 4 (four) hours as needed.   triamterene-hydrochlorothiazide (MAXZIDE-25) 37.5-25 MG tablet Take 1 tablet by mouth daily.     Allergies:    Nsaids   Social History   Socioeconomic History   Marital status: Married    Spouse name: Not on file   Number of children: 1   Years of education: Not on file   Highest education level: Not on file  Occupational History   Not on file  Tobacco Use   Smoking status: Never   Smokeless tobacco: Never  Vaping Use   Vaping status: Never Used  Substance and Sexual Activity   Alcohol use: Not Currently   Drug use: Never   Sexual activity: Yes    Birth control/protection: Surgical    Comment: ablation,  Other Topics Concern   Not on file  Social History Narrative   Financial controller   Social Drivers of Health   Financial Resource Strain: Not on file  Food Insecurity: Low Risk  (09/29/2023)   Received from [facility]   Hunger Vital Sign    Within the past 12 months, you worried that your food would run out before you got money to buy more: Never true    Within the past 12 months, the food you bought just didn't last and you didn't have money to get more. : Never true  Transportation Needs: No Transportation Needs (09/29/2023)   Received from [facility]    In the past 12 months, has lack of  reliable transportation kept you from medical appointments, meetings, work or from getting things needed for daily living? : No  Physical Activity: High Risk (04/16/2021)   Received from [facility]   Physical Activity    How often do you engage in moderate physical activity for 30 minutes or more?: 1 to 3 days a week    How often do you engage in vigorous physical activity for 20 minutes or more?: 1 to 3 days a week    How many hours per day do you spend sitting?: More than 8 hours  Stress: High Risk (04/17/2022)   Received from [facility]   Stress    Stress in your Life: Not on file    Dealing with Stress: Not on file  Social Connections: Not on file     Family History: The patient's family history includes Arrhythmia in her father and mother; Colon  polyps in her mother; Diabetes in her brother and father; Heart disease in her brother and father; Heart failure in her father; Hypertension in her mother; Irritable bowel syndrome in her mother; Kidney disease in her brother and father. There is no history of Colon cancer, Esophageal cancer, Stomach cancer, Rectal cancer, or Ulcerative colitis.  ROS:   Please see the history of present illness.     All other systems reviewed and are negative.  EKGs/Labs/Other Studies Reviewed:    The following studies were reviewed today: EKG Interpretation Date/Time:  Monday August 22 2024 11:36:32 EST Ventricular Rate:  62 PR Interval:  124 QRS Duration:  84 QT Interval:  444 QTC Calculation: 450 R Axis:   65  Text Interpretation: Normal sinus rhythm Normal ECG No previous ECGs available Confirmed by Kawika Bischoff [phone]) on 08/22/2024 11:37:27 AM   EKG Interpretation Date/Time:  Monday August 22 2024 11:36:32 EST Ventricular Rate:  62 PR Interval:  124 QRS Duration:  84 QT Interval:  444 QTC Calculation: 450 R Axis:   65  Text Interpretation: Normal sinus rhythm Normal ECG No previous ECGs available Confirmed by Naitik Hermann [phone]) on 08/22/2024 11:37:27 AM    Recent Labs: 04/07/2024: Hemoglobin 12.8; Platelets 207.0  Recent Lipid Panel No results found for: CHOL, TRIG, HDL, CHOLHDL, VLDL, LDLCALC, LDLDIRECT Fated 07/07/24: cholesterol 202, triglycerides 76, HDL 77. A1c 5.7%. normal Hgb, creatinine and ALT.   Risk Assessment/Calculations:                Physical Exam:    VS:  BP 96/63   Pulse 64   Ht 5' 2 (1.575 m)   Wt 128 lb 11.2 oz (58.4 kg)   SpO2 96%   BMI 23.54 kg/m     Wt Readings from Last 3 Encounters:  08/22/24 128 lb 11.2 oz (58.4 kg)  08/01/24 125 lb (56.7 kg)  06/02/24 129 lb (58.5 kg)     GEN:  Well nourished, well developed in no acute distress HEENT: Normal NECK: No JVD; No carotid bruits LYMPHATICS: No lymphadenopathy CARDIAC: RRR,  no murmurs, rubs, gallops RESPIRATORY:  Clear to auscultation without rales, wheezing or rhonchi  ABDOMEN: Soft, non-tender, non-distended MUSCULOSKELETAL:  No edema; No deformity  SKIN: Warm and dry NEUROLOGIC:  Alert and oriented x 3 PSYCHIATRIC:  Normal affect   ASSESSMENT:    1. Edema, unspecified type    PLAN:    In order of problems listed above:  Edema. Exam looks good today. Suspect this is mostly related to venous insufficiency. Given family history will check an Echo. Encourage elevation and  support hose. Given low BP I would avoid diuretics unless absolutely needed. If Echo is OK will follow up PRN           Medication Adjustments/Labs and Tests Ordered: Current medicines are reviewed at length with the patient today.  Concerns regarding medicines are outlined above.  Orders Placed This Encounter  Procedures   EKG 12-Lead   No orders of the defined types were placed in this encounter.   Patient Instructions  Medication Instructions:   *If you need a refill on your cardiac medications before your next appointment, please call your pharmacy*  Lab Work:   Testing/Procedures:   Follow-Up: At [facility], you and your health needs are our priority.  As part of our continuing mission to provide you with exceptional heart care, our providers are all part of one team.  This team includes your primary Cardiologist (physician) and Advanced Practice Providers or APPs (Physician Assistants and Nurse Practitioners) who all work together to provide you with the care you need, when you need it.  Your next appointment:      Provider:     We recommend signing up for the patient portal called MyChart.  Sign up information is provided on this After Visit Summary.  MyChart is used to connect with patients for Virtual Visits (Telemedicine).  Patients are able to view lab/test results, encounter notes, upcoming appointments, etc.  Non-urgent messages can be sent  to your provider as well.   To learn more about what you can do with MyChart, go to [email or web address].         Signed, Orman Matsumura, MD  08/22/2024 12:00 PM    [facility]

## 2024-08-18 ENCOUNTER — Telehealth: Payer: Self-pay | Admitting: Gastroenterology

## 2024-08-18 DIAGNOSIS — R1032 Left lower quadrant pain: Secondary | ICD-10-CM

## 2024-08-18 DIAGNOSIS — K5730 Diverticulosis of large intestine without perforation or abscess without bleeding: Secondary | ICD-10-CM

## 2024-08-18 DIAGNOSIS — R198 Other specified symptoms and signs involving the digestive system and abdomen: Secondary | ICD-10-CM

## 2024-08-18 DIAGNOSIS — K5909 Other constipation: Secondary | ICD-10-CM

## 2024-08-18 DIAGNOSIS — R140 Abdominal distension (gaseous): Secondary | ICD-10-CM

## 2024-08-18 NOTE — Telephone Encounter (Signed)
 Inbound call from patient stating she was given antibiotics after last office visit and it was working fine but 4-5 days after patient stopped antibiotics the pain has came back. Patient would like to be advised on what to do  Requesting a call back Please advise  Thank you

## 2024-08-18 NOTE — Telephone Encounter (Signed)
 The pt was given flagyl for possible diverticulitis at last office visit for 7 day course.  She felt good for several days but about day 4 after completion she began to have lower left pain radiating to the left back.  She is tender to touch.  The pain can be sharp at times but is always a dull constant ache.  Some alternating loose stools.  No bleeding.  Please advise

## 2024-08-18 NOTE — Telephone Encounter (Signed)
 The pt has been advised and pt agrees to the CT scan.  Order entered and sent to the schedulers

## 2024-08-18 NOTE — Telephone Encounter (Signed)
 It was not clear that this pain is truly diverticulitis.  Might be related to her chronic constipation/altered bowel habits or perhaps something else.  My advice is a CT abdomen and pelvis with oral and IV contrast.  VEAR Brand MD

## 2024-08-20 ENCOUNTER — Ambulatory Visit (HOSPITAL_BASED_OUTPATIENT_CLINIC_OR_DEPARTMENT_OTHER)
Admission: RE | Admit: 2024-08-20 | Discharge: 2024-08-20 | Disposition: A | Discharge status: Home / Self Care | Source: Ambulatory Visit | Attending: Gastroenterology | Admitting: Gastroenterology

## 2024-08-20 DIAGNOSIS — K5730 Diverticulosis of large intestine without perforation or abscess without bleeding: Secondary | ICD-10-CM | POA: Insufficient documentation

## 2024-08-20 DIAGNOSIS — R1032 Left lower quadrant pain: Secondary | ICD-10-CM | POA: Insufficient documentation

## 2024-08-20 DIAGNOSIS — R198 Other specified symptoms and signs involving the digestive system and abdomen: Secondary | ICD-10-CM | POA: Insufficient documentation

## 2024-08-20 DIAGNOSIS — R140 Abdominal distension (gaseous): Secondary | ICD-10-CM | POA: Insufficient documentation

## 2024-08-20 DIAGNOSIS — K5909 Other constipation: Secondary | ICD-10-CM | POA: Insufficient documentation

## 2024-08-20 MED ORDER — IOHEXOL 300 MG/ML  SOLN
100.0000 mL | Freq: Once | INTRAMUSCULAR | Status: AC | PRN
  Administered 2024-08-20: 100 mL via INTRAVENOUS

## 2024-08-21 ENCOUNTER — Ambulatory Visit: Payer: Self-pay | Admitting: Gastroenterology

## 2024-08-22 ENCOUNTER — Ambulatory Visit: Attending: Cardiology | Admitting: Cardiology

## 2024-08-22 ENCOUNTER — Encounter: Payer: Self-pay | Admitting: Cardiology

## 2024-08-22 ENCOUNTER — Encounter: Payer: Self-pay | Admitting: Radiology

## 2024-08-22 VITALS — BP 96/63 | HR 64 | Ht 62.0 in | Wt 128.7 lb

## 2024-08-22 DIAGNOSIS — R609 Edema, unspecified: Secondary | ICD-10-CM

## 2024-08-22 MED ORDER — HYOSCYAMINE SULFATE 0.125 MG SL SUBL: 0.1250 mg | SUBLINGUAL_TABLET | Freq: Two times a day (BID) | SUBLINGUAL | 1 refills | Status: AC | PRN

## 2024-08-22 NOTE — Patient Instructions (Addendum)
 Medication Instructions:  Continue same medications  Lab Work: None ordered  Testing/Procedures: Echo   first available  Follow-Up: At [facility], you and your health needs are our priority.  As part of our continuing mission to provide you with exceptional heart care, our providers are all part of one team.  This team includes your primary Cardiologist (physician) and Advanced Practice Providers or APPs (Physician Assistants and Nurse Practitioners) who all work together to provide you with the care you need, when you need it.  Your next appointment:  To Be Determined after echo    Provider:  Dr.Jordan    We recommend signing up for the patient portal called MyChart.  Sign up information is provided on this After Visit Summary.  MyChart is used to connect with patients for Virtual Visits (Telemedicine).  Patients are able to view lab/test results, encounter notes, upcoming appointments, etc.  Non-urgent messages can be sent to your provider as well.   To learn more about what you can do with MyChart, go to [email or web address].

## 2024-08-24 ENCOUNTER — Encounter: Payer: Self-pay | Admitting: Cardiology

## 2024-09-09 ENCOUNTER — Ambulatory Visit: Admitting: Gastroenterology

## 2024-09-18 ENCOUNTER — Other Ambulatory Visit: Payer: Self-pay | Admitting: Gastroenterology

## 2024-09-27 ENCOUNTER — Ambulatory Visit (HOSPITAL_COMMUNITY): Admission: RE | Admit: 2024-09-27 | Discharge: 2024-09-27 | Attending: Cardiology | Admitting: Cardiology

## 2024-09-27 ENCOUNTER — Ambulatory Visit: Payer: Self-pay | Admitting: Cardiology

## 2024-09-27 DIAGNOSIS — R600 Localized edema: Secondary | ICD-10-CM | POA: Diagnosis not present

## 2024-09-27 DIAGNOSIS — R609 Edema, unspecified: Secondary | ICD-10-CM

## 2024-09-27 LAB — ECHOCARDIOGRAM COMPLETE
Area-P 1/2: 2.76 cm2
S' Lateral: 3 cm

## 2024-10-31 ENCOUNTER — Encounter: Payer: Self-pay | Admitting: Gastroenterology

## 2024-10-31 ENCOUNTER — Ambulatory Visit: Admitting: Gastroenterology

## 2024-10-31 VITALS — BP 118/48 | HR 84 | Ht 62.0 in | Wt 131.1 lb

## 2024-10-31 DIAGNOSIS — R1032 Left lower quadrant pain: Secondary | ICD-10-CM

## 2024-10-31 DIAGNOSIS — K5909 Other constipation: Secondary | ICD-10-CM | POA: Diagnosis not present

## 2024-10-31 NOTE — Patient Instructions (Signed)
 _______________________________________________________  If your blood pressure at your visit was 140/90 or greater, please contact your primary care physician to follow up on this.  _______________________________________________________  If you are age 54 or older, your body mass index should be between 23-30. Your Body mass index is 23.98 kg/m. If this is out of the aforementioned range listed, please consider follow up with your Primary Care Provider.  If you are age 36 or younger, your body mass index should be between 19-25. Your Body mass index is 23.98 kg/m. If this is out of the aformentioned range listed, please consider follow up with your Primary Care Provider.   ________________________________________________________  The [facility] providers would like to encourage you to use MYCHART to communicate with providers for non-urgent requests or questions.  Due to long hold times on the telephone, sending your provider a message by [facility] may be a faster and more efficient way to get a response.  Please allow 48 business hours for a response.  Please remember that this is for non-urgent requests.  _______________________________________________________  [facility] is using a team-based approach to care.  Your team is made up of your doctor and two to three APPS. Our APPS (Nurse Practitioners and Physician Assistants) work with your physician to ensure care continuity for you. They are fully qualified to address your health concerns and develop a treatment plan. They communicate directly with your gastroenterologist to care for you. Seeing the Advanced Practice Practitioners on your physician's team can help you by facilitating care more promptly, often allowing for earlier appointments, access to diagnostic testing, procedures, and other specialty referrals.    Thank you for trusting me with your gastrointestinal care!    Dr. Victory Legrand DOUGLAS [facility]

## 2024-10-31 NOTE — Progress Notes (Signed)
 "     [facility] Progress Note  Chief Complaint: Chronic constipation and abdominal pain  Subjective  Prior history  Clinic consult June 2025 with intermittent epigastric pain and alternating constipation and diarrhea.  Prior cholecystectomy Low ferritin and iron saturation with normal hemoglobin 06/02/2024:    EGD normal, gastric and duodenal biopsies normal                colonoscopy with diffuse melanosis, biopsies negative for microscopic colitis.  Diminutive tubular adenoma, left-sided diverticulosis, internal hemorrhoids  October 2025 clinic visit, chronic irregular bowel habits, constipation/diarrhea and left lower quadrant pain with tenderness.  7-day course antibiotics given for ?  Low-grade diverticulitis (low clinical suspicion) Little improvement with that, subsequent CTAP November 2025  -mild left colon wall thickening radiologist question colitis.  AFL from liquid stool related to Linzess .  No colitis seen on colonoscopy 3 months prior. Recommend continue Linzess  and try as needed hyoscyamine    History of Present Illness   Caitlin Martinez has been doing fairly well since I last saw her.  Abdominal pain is still present on the left side, sometimes burning or dull and waxes and wanes.  But overall less noticeable than before.  Bowel habits 2 tend toward constipation and she takes the Linzess  145 mcg daily.  (It is the only dose she has taken since starting it) She will go through cycles of 8 to 10 days where she will have a BM once or twice a day that tends to be semiformed or loose but then might have feelings of urgency with no BM for day or so.  Has not needed the Levsin  since it was prescribed.  Appetite good, weight stable. She also relays some family and work-related stress in the last few years.  Tended toward constipation throughout much of her youth, then seem to get better many years, and bother her more so over the last several years.  She no longer has menses after her prior  endometrial ablation, says she had blood work last year with primary care or gynecology showing that she was in perimenopause.  ROS: Cardiovascular:  no chest pain Respiratory: no dyspnea  The patient's Past Medical, Family and Social History were reviewed and are on file in the EMR. Past Medical History:  Diagnosis Date   Anemia    GERD (gastroesophageal reflux disease)    Migraines     Past Surgical History:  Procedure Laterality Date   AUGMENTATION MAMMAPLASTY Bilateral 2016   CHOLECYSTECTOMY     PLACEMENT OF BREAST IMPLANTS     SHOULDER SURGERY Right 1995     Objective:  Med list reviewed Current Medications[1]   Vital signs in last 24 hrs: Vitals:   10/31/24 0843  BP: (!) 118/48  Pulse: 84   Wt Readings from Last 3 Encounters:  10/31/24 131 lb 2 oz (59.5 kg)  08/22/24 128 lb 11.2 oz (58.4 kg)  08/01/24 125 lb (56.7 kg)    Physical Exam  Well-appearing  Cardiac: Regular without appreciable murmur,  no peripheral edema Pulm: clear to auscultation bilaterally, normal RR and effort noted Abdomen: soft, no tenderness, with active bowel sounds. No guarding or palpable hepatosplenomegaly. Skin; warm and dry, no jaundice or rash   Labs:   ___________________________________________ Radiologic studies:  Narrative & Impression  CLINICAL DATA:  Abdominal pain.  Suspected diverticulitis.   EXAM: CT ABDOMEN AND PELVIS WITH CONTRAST   TECHNIQUE: Multidetector CT imaging of the abdomen and pelvis was performed using the standard protocol following  bolus administration of intravenous contrast.   RADIATION DOSE REDUCTION: This exam was performed according to the departmental dose-optimization program which includes automated exposure control, adjustment of the mA and/or kV according to patient size and/or use of iterative reconstruction technique.   CONTRAST:  100mL OMNIPAQUE  IOHEXOL  300 MG/ML  SOLN   COMPARISON:  None Available.   FINDINGS: Lower  Chest: No acute findings.   Hepatobiliary: No suspicious hepatic masses identified. Prior cholecystectomy. No evidence of biliary obstruction.   Pancreas:  No mass or inflammatory changes.   Spleen: Within normal limits in size and appearance.   Adrenals/Urinary Tract: No suspicious masses identified. No evidence of ureteral calculi or hydronephrosis.   Stomach/Bowel: No dilated bowel loops. Mild colonic wall thickening and mucosal hyperenhancement is seen, with greatest involvement of the rectosigmoid colon and cecum. Air-fluid levels are also seen throughout the colon, often associated with diarrheal illness. No evidence of small bowel or terminal ileal involvement. No evidence of abscess or free fluid. Normal appendix visualized.   Vascular/Lymphatic: No pathologically enlarged lymph nodes. No acute vascular findings.   Reproductive:  No mass or other significant abnormality.   Other:  None.   Musculoskeletal:  No suspicious bone lesions identified.   IMPRESSION: Mild colitis predominantly involving the rectosigmoid colon and cecum. Differential diagnosis includes infectious etiologies and ulcerative colitis.   No evidence of small bowel involvement, bowel obstruction, or abscess.     Electronically Signed   By: Norleen DELENA Kil M.D.   On: 08/21/2024 07:28   ____________________________________________ Other:   _____________________________________________   Encounter Diagnoses  Name Primary?   Chronic constipation Yes   LLQ abdominal pain      Assessment & Plan  Motility related chronic constipation currently under reasonably good control with Linzess  once daily.  She will still have some irregularity and resultant left lower quadrant pain but she has not felt the need for antispasmodic since it was prescribed after the CT scan. CT scan findings do not represent colitis (see above)  It may be that some of the motility issues worsen in recent years from  perimenopausal hormonal changes as well.  We talked about possibility of a change to Motegrity, understanding the possible difficulties with insurance formulary or cost.  She would like to continue Linzess  let me know if she changes her mind regarding treatment.     I spent total of 25 minutes in both face-to-face (20 minutes interview/exam) and non-face-to-face (5 minutes chart review, care coordination, documentation)  activities, excluding procedures performed, for the visit on the date of this encounter.   Victory LITTIE Brand III     [1]  Current Outpatient Medications:    ALPRAZolam (XANAX) 0.25 MG tablet, TAKE 1 TABLET BY MOUTH EVERY DAY AS NEEDED SLEEP, Disp: , Rfl: 1   Diethylpropion HCl CR 75 MG TB24, Take 1 tablet by mouth every morning., Disp: , Rfl:    Ketorolac  Tromethamine  15.75 MG/SPRAY SOLN, Place 1 each into the nose., Disp: , Rfl:    LINZESS  145 MCG CAPS capsule, TAKE 1 CAPSULE BY MOUTH DAILY BEFORE BREAKFAST., Disp: 30 capsule, Rfl: 2   promethazine (PHENERGAN) 25 MG tablet, Take 25 mg by mouth every 4 (four) hours as needed., Disp: , Rfl:    triamterene-hydrochlorothiazide (MAXZIDE-25) 37.5-25 MG tablet, Take 1 tablet by mouth daily., Disp: , Rfl:    hyoscyamine  (LEVSIN  SL) 0.125 MG SL tablet, Place 1 tablet (0.125 mg total) under the tongue 2 (two) times daily as needed for cramping. (Patient not  taking: Reported on 10/31/2024), Disp: 45 tablet, Rfl: 1  "
